# Patient Record
Sex: Male | Born: 1960 | Race: White | Hispanic: No | Smoking: Never smoker
Health system: Southern US, Community
[De-identification: ages and names within clinical notes are randomized; demographics above are authoritative.]

## PROBLEM LIST (undated history)

## (undated) DIAGNOSIS — G8220 Paraplegia, unspecified: Secondary | ICD-10-CM

## (undated) DIAGNOSIS — M7070 Other bursitis of hip, unspecified hip: Secondary | ICD-10-CM

## (undated) DIAGNOSIS — I4510 Unspecified right bundle-branch block: Secondary | ICD-10-CM

## (undated) DIAGNOSIS — F419 Anxiety disorder, unspecified: Secondary | ICD-10-CM

## (undated) DIAGNOSIS — E119 Type 2 diabetes mellitus without complications: Secondary | ICD-10-CM

## (undated) DIAGNOSIS — D649 Anemia, unspecified: Secondary | ICD-10-CM

## (undated) DIAGNOSIS — N3289 Other specified disorders of bladder: Secondary | ICD-10-CM

## (undated) DIAGNOSIS — M62838 Other muscle spasm: Secondary | ICD-10-CM

## (undated) DIAGNOSIS — I890 Lymphedema, not elsewhere classified: Secondary | ICD-10-CM

## (undated) DIAGNOSIS — G4733 Obstructive sleep apnea (adult) (pediatric): Secondary | ICD-10-CM

## (undated) DIAGNOSIS — F329 Major depressive disorder, single episode, unspecified: Secondary | ICD-10-CM

## (undated) DIAGNOSIS — Z789 Other specified health status: Secondary | ICD-10-CM

## (undated) DIAGNOSIS — G4730 Sleep apnea, unspecified: Secondary | ICD-10-CM

## (undated) DIAGNOSIS — K649 Unspecified hemorrhoids: Secondary | ICD-10-CM

## (undated) DIAGNOSIS — I739 Peripheral vascular disease, unspecified: Secondary | ICD-10-CM

## (undated) DIAGNOSIS — Z993 Dependence on wheelchair: Secondary | ICD-10-CM

## (undated) DIAGNOSIS — Z973 Presence of spectacles and contact lenses: Secondary | ICD-10-CM

## (undated) DIAGNOSIS — N319 Neuromuscular dysfunction of bladder, unspecified: Secondary | ICD-10-CM

## (undated) HISTORY — PX: THORACIC FUSION: SHX1062

## (undated) HISTORY — PX: CARPAL TUNNEL RELEASE: SHX101

## (undated) HISTORY — PX: NEUROMA SURGERY: SHX722

## (undated) HISTORY — PX: CYST REMOVAL HAND: SHX6279

---
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
## 1986-06-16 DIAGNOSIS — G8220 Paraplegia, unspecified: Secondary | ICD-10-CM

## 1986-06-16 DIAGNOSIS — N319 Neuromuscular dysfunction of bladder, unspecified: Secondary | ICD-10-CM

## 1986-06-16 HISTORY — DX: Paraplegia, unspecified: G82.20

## 1986-06-16 HISTORY — DX: Neuromuscular dysfunction of bladder, unspecified: N31.9

## 1986-06-16 HISTORY — PX: THORACIC FUSION: SHX1062

## 1987-02-26 DIAGNOSIS — G8220 Paraplegia, unspecified: Secondary | ICD-10-CM | POA: Insufficient documentation

## 2000-05-06 ENCOUNTER — Emergency Department (HOSPITAL_COMMUNITY): Admission: EM | Admit: 2000-05-06 | Discharge: 2000-05-06 | Payer: Self-pay | Admitting: Emergency Medicine

## 2000-05-06 ENCOUNTER — Encounter: Payer: Self-pay | Admitting: Emergency Medicine

## 2002-04-15 ENCOUNTER — Encounter: Payer: Self-pay | Admitting: Internal Medicine

## 2002-04-15 ENCOUNTER — Encounter: Admission: RE | Admit: 2002-04-15 | Discharge: 2002-04-15 | Payer: Self-pay | Admitting: Internal Medicine

## 2002-06-16 HISTORY — PX: CARPAL TUNNEL RELEASE: SHX101

## 2003-08-09 ENCOUNTER — Encounter: Admission: RE | Admit: 2003-08-09 | Discharge: 2003-08-09 | Payer: Self-pay | Admitting: Internal Medicine

## 2005-03-20 ENCOUNTER — Emergency Department (HOSPITAL_COMMUNITY): Admission: EM | Admit: 2005-03-20 | Discharge: 2005-03-21 | Payer: Self-pay | Admitting: Emergency Medicine

## 2006-05-26 ENCOUNTER — Encounter: Admission: RE | Admit: 2006-05-26 | Discharge: 2006-05-26 | Payer: Self-pay | Admitting: Internal Medicine

## 2007-03-20 ENCOUNTER — Emergency Department (HOSPITAL_COMMUNITY): Admission: EM | Admit: 2007-03-20 | Discharge: 2007-03-20 | Payer: Self-pay | Admitting: Emergency Medicine

## 2008-04-13 ENCOUNTER — Inpatient Hospital Stay (HOSPITAL_COMMUNITY): Admission: EM | Admit: 2008-04-13 | Discharge: 2008-04-15 | Payer: Self-pay | Admitting: Emergency Medicine

## 2009-03-26 ENCOUNTER — Encounter (INDEPENDENT_AMBULATORY_CARE_PROVIDER_SITE_OTHER): Payer: Self-pay | Admitting: Orthopedic Surgery

## 2009-03-26 ENCOUNTER — Ambulatory Visit (HOSPITAL_BASED_OUTPATIENT_CLINIC_OR_DEPARTMENT_OTHER): Admission: RE | Admit: 2009-03-26 | Discharge: 2009-03-26 | Payer: Self-pay | Admitting: Orthopedic Surgery

## 2009-03-26 HISTORY — PX: NEUROMA SURGERY: SHX722

## 2010-03-20 ENCOUNTER — Encounter: Admission: RE | Admit: 2010-03-20 | Discharge: 2010-03-20 | Payer: Self-pay | Admitting: Internal Medicine

## 2010-09-19 LAB — POCT HEMOGLOBIN-HEMACUE: Hemoglobin: 17.2 g/dL — ABNORMAL HIGH (ref 13.0–17.0)
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
## 2010-10-29 NOTE — Op Note (Signed)
NAME:  David Odom, David Odom NO.:  [id number]   MEDICAL RECORD NO.:  [id number]          PATIENT TYPE:  INP   LOCATION:  3713                         FACILITY:  [facility]   PHYSICIAN:  Graylin Shiver, M.D.   DATE OF BIRTH:  1960/07/18   DATE OF PROCEDURE:  04/14/2008  DATE OF DISCHARGE:  04/15/2008                               OPERATIVE REPORT   INDICATIONS FOR PROCEDURE:  Rectal bleeding.   Informed consent was obtained after explanation of the risks of  bleeding, infection, and perforation.   PREMEDICATION:  None.   PROCEDURE:  With the patient in the left lateral decubitus position, a  rectal exam was performed.  There were large external hemorrhoids.  There was no blood on the glove finger after rectal examination.  The  Pentax colonoscope was inserted into the rectum and advanced up to 15  cm.  There was brownish stool in the colon.  There was no blood.  There  were no lesions seen in the rectosigmoid area.  There was redness in the  anal canal.  There was no active bleeding.  He tolerated the procedure  well without complications.   IMPRESSION:  Rectal bleeding, most likely of an anorectal/hemorrhoids  source.  No active bleeding at this time.           ______________________________  Graylin Shiver, M.D.     SFG/MEDQ  D:  04/17/2008  T:  04/17/2008  Job:  [id number]   cc:   Theressa Millard, M.D.

## 2011-02-27 ENCOUNTER — Ambulatory Visit (HOSPITAL_COMMUNITY)
Admission: RE | Admit: 2011-02-27 | Discharge: 2011-02-27 | Disposition: A | Discharge status: Home / Self Care | Payer: Medicare Other | Source: Ambulatory Visit | Attending: Gastroenterology | Admitting: Gastroenterology

## 2011-02-27 DIAGNOSIS — Z860100 Personal history of colon polyps, unspecified: Secondary | ICD-10-CM | POA: Insufficient documentation

## 2011-02-27 DIAGNOSIS — K648 Other hemorrhoids: Secondary | ICD-10-CM | POA: Insufficient documentation

## 2011-02-27 DIAGNOSIS — K921 Melena: Secondary | ICD-10-CM | POA: Insufficient documentation

## 2011-02-27 DIAGNOSIS — T43205A Adverse effect of unspecified antidepressants, initial encounter: Secondary | ICD-10-CM | POA: Insufficient documentation

## 2011-02-27 DIAGNOSIS — K5909 Other constipation: Secondary | ICD-10-CM | POA: Insufficient documentation

## 2011-02-27 DIAGNOSIS — T443X5A Adverse effect of other parasympatholytics [anticholinergics and antimuscarinics] and spasmolytics, initial encounter: Secondary | ICD-10-CM | POA: Insufficient documentation

## 2011-02-27 DIAGNOSIS — G8220 Paraplegia, unspecified: Secondary | ICD-10-CM | POA: Insufficient documentation

## 2011-02-27 DIAGNOSIS — R1084 Generalized abdominal pain: Secondary | ICD-10-CM | POA: Insufficient documentation

## 2011-02-27 DIAGNOSIS — T503X5A Adverse effect of electrolytic, caloric and water-balance agents, initial encounter: Secondary | ICD-10-CM | POA: Insufficient documentation

## 2011-02-27 DIAGNOSIS — IMO0002 Reserved for concepts with insufficient information to code with codable children: Secondary | ICD-10-CM | POA: Insufficient documentation
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
## 2011-03-18 LAB — BASIC METABOLIC PANEL
BUN: 11
CO2: 23
Calcium: 8.1 — ABNORMAL LOW
Chloride: 108
Creatinine, Ser: 0.65
GFR calc Af Amer: >60
GFR calc non Af Amer: >60
Glucose, Bld: 99
Potassium: 3.7
Sodium: 137

## 2011-03-18 LAB — DIFFERENTIAL
Basophils Absolute: 0
Basophils Relative: 0
Eosinophils Absolute: 0.2
Eosinophils Relative: 2
Eosinophils Relative: 2
Lymphocytes Relative: 19
Lymphocytes Relative: 22
Lymphs Abs: 1.5
Lymphs Abs: 1.7
Monocytes Absolute: 0.9
Monocytes Relative: 11
Neutro Abs: 5.2
Neutro Abs: 5.3
Neutrophils Relative %: 65
Neutrophils Relative %: 67

## 2011-03-18 LAB — CULTURE, BLOOD (ROUTINE X 2): Culture: NO GROWTH

## 2011-03-18 LAB — CBC
HCT: 42.1
HCT: 43.4
HCT: 47.6
Hemoglobin: 14.5
Hemoglobin: 14.8
MCHC: 34.2
MCHC: 34.5
MCV: 91.9
MCV: 92.2
MCV: 92.2
Platelets: 179
Platelets: 200
Platelets: 221
RBC: 4.56
RBC: 4.72
RDW: 13
RDW: 13.2
RDW: 13.5
WBC: 7.9
WBC: 8.2

## 2011-03-18 LAB — COMPREHENSIVE METABOLIC PANEL
ALT: 72 — ABNORMAL HIGH
AST: 42 — ABNORMAL HIGH
Albumin: 3.9
Alkaline Phosphatase: 75
BUN: 13
Chloride: 107
GFR calc Af Amer: >60
GFR calc non Af Amer: >60
Potassium: 4.4
Total Bilirubin: 0.8
Total Protein: 7

## 2011-03-18 LAB — HEMOGLOBIN AND HEMATOCRIT, BLOOD
HCT: 42.7
HCT: 42.8
HCT: 42.8
HCT: 44.6
HCT: 46.1
Hemoglobin: 14.5
Hemoglobin: 14.6
Hemoglobin: 15.1
Hemoglobin: 15.2
Hemoglobin: 15.7

## 2011-03-18 LAB — POCT CARDIAC MARKERS: CKMB, poc: 4.8

## 2011-03-18 LAB — OCCULT BLOOD X 1 CARD TO LAB, STOOL: Fecal Occult Bld: POSITIVE

## 2011-03-18 LAB — PROTIME-INR: Prothrombin Time: 13.4

## 2012-03-18 DIAGNOSIS — Z1331 Encounter for screening for depression: Secondary | ICD-10-CM | POA: Diagnosis not present

## 2012-03-18 DIAGNOSIS — Z125 Encounter for screening for malignant neoplasm of prostate: Secondary | ICD-10-CM | POA: Diagnosis not present

## 2012-03-18 DIAGNOSIS — R7309 Other abnormal glucose: Secondary | ICD-10-CM | POA: Diagnosis not present

## 2012-03-18 DIAGNOSIS — G4733 Obstructive sleep apnea (adult) (pediatric): Secondary | ICD-10-CM | POA: Diagnosis not present

## 2012-03-18 DIAGNOSIS — Z23 Encounter for immunization: Secondary | ICD-10-CM | POA: Diagnosis not present

## 2012-03-18 DIAGNOSIS — Z0000 Encounter for general adult medical examination without abnormal findings: Secondary | ICD-10-CM | POA: Diagnosis not present

## 2012-03-25 DIAGNOSIS — R7309 Other abnormal glucose: Secondary | ICD-10-CM | POA: Diagnosis not present

## 2012-04-13 DIAGNOSIS — E119 Type 2 diabetes mellitus without complications: Secondary | ICD-10-CM | POA: Diagnosis not present

## 2012-05-19 DIAGNOSIS — E119 Type 2 diabetes mellitus without complications: Secondary | ICD-10-CM | POA: Diagnosis not present

## 2012-06-28 DIAGNOSIS — N390 Urinary tract infection, site not specified: Secondary | ICD-10-CM | POA: Diagnosis not present

## 2012-09-27 DIAGNOSIS — G4733 Obstructive sleep apnea (adult) (pediatric): Secondary | ICD-10-CM | POA: Diagnosis not present

## 2012-09-27 DIAGNOSIS — E119 Type 2 diabetes mellitus without complications: Secondary | ICD-10-CM | POA: Diagnosis not present

## 2012-11-08 DIAGNOSIS — M545 Low back pain: Secondary | ICD-10-CM | POA: Diagnosis not present

## 2012-11-17 DIAGNOSIS — G8220 Paraplegia, unspecified: Secondary | ICD-10-CM | POA: Diagnosis not present

## 2012-11-17 DIAGNOSIS — IMO0002 Reserved for concepts with insufficient information to code with codable children: Secondary | ICD-10-CM | POA: Diagnosis not present

## 2012-12-07 DIAGNOSIS — M546 Pain in thoracic spine: Secondary | ICD-10-CM | POA: Diagnosis not present

## 2012-12-15 ENCOUNTER — Other Ambulatory Visit: Payer: Self-pay | Admitting: Neurological Surgery

## 2012-12-15 DIAGNOSIS — M546 Pain in thoracic spine: Secondary | ICD-10-CM

## 2012-12-21 ENCOUNTER — Ambulatory Visit
Admission: RE | Admit: 2012-12-21 | Discharge: 2012-12-21 | Disposition: A | Discharge status: Home / Self Care | Payer: Medicare Other | Source: Ambulatory Visit | Attending: Neurological Surgery | Admitting: Neurological Surgery

## 2012-12-21 VITALS — BP 64/36 | HR 93

## 2012-12-21 DIAGNOSIS — M546 Pain in thoracic spine: Secondary | ICD-10-CM

## 2012-12-21 MED ORDER — DIAZEPAM 5 MG PO TABS: 10.0000 mg | ORAL_TABLET | Freq: Once | ORAL | Status: DC

## 2012-12-21 MED ORDER — IOHEXOL 300 MG/ML  SOLN
10.0000 mL | Freq: Once | INTRAMUSCULAR | Status: AC | PRN
  Administered 2012-12-21: 10 mL via INTRATHECAL

## 2012-12-21 NOTE — Progress Notes (Signed)
Patient states he has been off Pamelor for the past two days.

## 2012-12-28 DIAGNOSIS — Z6829 Body mass index (BMI) 29.0-29.9, adult: Secondary | ICD-10-CM | POA: Diagnosis not present

## 2012-12-28 DIAGNOSIS — M546 Pain in thoracic spine: Secondary | ICD-10-CM | POA: Diagnosis not present

## 2012-12-29 ENCOUNTER — Encounter (HOSPITAL_COMMUNITY): Payer: Self-pay | Admitting: Pharmacy Technician

## 2012-12-31 ENCOUNTER — Other Ambulatory Visit: Payer: Self-pay | Admitting: Neurological Surgery

## 2013-01-04 ENCOUNTER — Encounter (HOSPITAL_COMMUNITY): Payer: Self-pay

## 2013-01-04 ENCOUNTER — Encounter (HOSPITAL_COMMUNITY)
Admission: RE | Admit: 2013-01-04 | Discharge: 2013-01-04 | Disposition: A | Discharge status: Home / Self Care | Payer: Medicare Other | Source: Ambulatory Visit | Attending: Neurological Surgery | Admitting: Neurological Surgery

## 2013-01-04 ENCOUNTER — Encounter (HOSPITAL_COMMUNITY): Payer: Self-pay | Admitting: Vascular Surgery

## 2013-01-04 ENCOUNTER — Ambulatory Visit (HOSPITAL_COMMUNITY)
Admission: RE | Admit: 2013-01-04 | Discharge: 2013-01-04 | Disposition: A | Discharge status: Home / Self Care | Payer: Medicare Other | Source: Ambulatory Visit | Attending: Neurological Surgery | Admitting: Neurological Surgery

## 2013-01-04 DIAGNOSIS — Z0183 Encounter for blood typing: Secondary | ICD-10-CM | POA: Insufficient documentation

## 2013-01-04 DIAGNOSIS — Z01818 Encounter for other preprocedural examination: Secondary | ICD-10-CM | POA: Diagnosis not present

## 2013-01-04 DIAGNOSIS — Z01812 Encounter for preprocedural laboratory examination: Secondary | ICD-10-CM | POA: Insufficient documentation

## 2013-01-04 HISTORY — DX: Sleep apnea, unspecified: G47.30

## 2013-01-04 HISTORY — DX: Type 2 diabetes mellitus without complications: E11.9

## 2013-01-04 LAB — SURGICAL PCR SCREEN
MRSA, PCR: NEGATIVE
Staphylococcus aureus: NEGATIVE

## 2013-01-04 LAB — BASIC METABOLIC PANEL
BUN: 11 mg/dL (ref 6–23)
Calcium: 8.8 mg/dL (ref 8.4–10.5)
GFR calc Af Amer: >90 mL/min (ref >90)
GFR calc non Af Amer: >90 mL/min (ref >90)
Glucose, Bld: 91 mg/dL (ref 70–99)
Sodium: 138 mEq/L (ref 135–145)

## 2013-01-04 LAB — TYPE AND SCREEN
ABO/RH(D): B POS
Antibody Screen: NEGATIVE

## 2013-01-04 LAB — PROTIME-INR: Prothrombin Time: 14.1 seconds (ref 11.6–15.2)

## 2013-01-04 NOTE — Progress Notes (Signed)
Primary physician - Dr. Particia Lather No cardiologist Sleep study approximately 5 years ago. No prior cardiac testing

## 2013-01-04 NOTE — Pre-Procedure Instructions (Signed)
David Odom  01/04/2013   Your procedure is scheduled on:  Friday, July 25th  Report to [facility] at 1145 AM.  Call this number if you have problems the morning of surgery: [phone]   Remember:   Do not eat food or drink liquids after midnight.   Take these medicines the morning of surgery with A SIP OF WATER: percocet if needed   Do not wear jewelry.  Do not wear lotions, powders, or perfume, deodorant.  Do not shave 48 hours prior to surgery. Men may shave face and neck.  Do not bring valuables to the hospital.  [facility] is not responsible  for any belongings or valuables.  Contacts, dentures or bridgework may not be worn into surgery.  Leave suitcase in the car. After surgery it may be brought to your room.  For patients admitted to the hospital, checkout time is 11:00 AM the day of discharge.   Patients discharged the day of surgery will not be allowed to drive home.    Special Instructions: Shower using CHG 2 nights before surgery and the night before surgery.  If you shower the day of surgery use CHG.  Use special wash - you have one bottle of CHG for all showers.  You should use approximately 1/3 of the bottle for each shower.   Please read over the following fact sheets that you were given: Pain Booklet, Coughing and Deep Breathing, Blood Transfusion Information, MRSA Information and Surgical Site Infection Prevention

## 2013-01-04 NOTE — Progress Notes (Signed)
CRITICAL VALUE ALERT  Critical value received:  Hemoglobin 5.9  Date of notification:  01/04/2013  Time of notification:  1405  Critical value read back:yes  Nurse who received alert:  Luevenia Maxin  MD notified (1st page):  Dr. Yetta Barre  Time of first page:  1430  MD notified (2nd page):  Time of second page:  Responding MD:  Message given to dr. Yetta Barre assistant  Time MD responded:  1430

## 2013-01-05 ENCOUNTER — Other Ambulatory Visit: Payer: Self-pay | Admitting: Neurological Surgery

## 2013-01-05 ENCOUNTER — Encounter (HOSPITAL_COMMUNITY)
Admission: RE | Admit: 2013-01-05 | Discharge: 2013-01-05 | Disposition: A | Discharge status: Home / Self Care | Payer: Medicare Other | Source: Ambulatory Visit | Attending: Neurological Surgery | Admitting: Neurological Surgery

## 2013-01-05 DIAGNOSIS — E119 Type 2 diabetes mellitus without complications: Secondary | ICD-10-CM | POA: Insufficient documentation

## 2013-01-05 DIAGNOSIS — G4733 Obstructive sleep apnea (adult) (pediatric): Secondary | ICD-10-CM | POA: Insufficient documentation

## 2013-01-05 DIAGNOSIS — Z01812 Encounter for preprocedural laboratory examination: Secondary | ICD-10-CM | POA: Insufficient documentation

## 2013-01-05 DIAGNOSIS — D649 Anemia, unspecified: Secondary | ICD-10-CM | POA: Insufficient documentation

## 2013-01-05 LAB — CBC WITH DIFFERENTIAL/PLATELET
Basophils Absolute: 0.1 10*3/uL (ref 0.0–0.1)
Basophils Absolute: 0.1 10*3/uL (ref 0.0–0.1)
Basophils Relative: 1 % (ref 0–1)
Eosinophils Absolute: 0.1 10*3/uL (ref 0.0–0.7)
HCT: 21.4 % — ABNORMAL LOW (ref 39.0–52.0)
Lymphocytes Relative: 23 % (ref 12–46)
MCHC: 27.6 g/dL — ABNORMAL LOW (ref 30.0–36.0)
MCHC: 27.2 g/dL — ABNORMAL LOW (ref 30.0–36.0)
Monocytes Absolute: 0.7 10*3/uL (ref 0.1–1.0)
Monocytes Relative: 13 % — ABNORMAL HIGH (ref 3–12)
Neutro Abs: 3.2 10*3/uL (ref 1.7–7.7)
Neutrophils Relative %: 60 % (ref 43–77)
Platelets: 400 10*3/uL (ref 150–400)
RDW: 18.5 % — ABNORMAL HIGH (ref 11.5–15.5)
RDW: 18.7 % — ABNORMAL HIGH (ref 11.5–15.5)
WBC: 5.9 10*3/uL (ref 4.0–10.5)
WBC: 4.9 10*3/uL (ref 4.0–10.5)

## 2013-01-05 NOTE — Progress Notes (Signed)
Anesthesiology Chart Review:  Patient is a 52 year old male scheduled for T8-9 fusion on 01/07/13 by Dr. Yetta Barre.  History includes non-smoker, OSA, DM2, prior back surgery. PCP is listed as Dr. Theressa Millard.  EKG on 01/04/13 showed NSR, Q wave lead III (consider inferior infarct, age undetermined).  His rate has decreased, but overall I think his EKG is stable when compared to 04/13/08.  CXR on 01/04/13 showed no edema or consolidation.  Dr. Lindalou Hose office was notified of a critical H/H of 5.9/21.4 yesterday.  Dr. Jordan Likes had patient come in again today for repeat labs which showed H/H 5.8/21.3 which is consistent with his previous results.  Target cells were noted on the RBC morphology.  I have called today's results to [facility] at Dr. Yetta Barre' office.  She will review labs with Dr. Yetta Barre for further recommendations.  He will need further evaluation/treatment of his significant anemia prior to surgery.  Velna Ochs [facility]/Anesthesiology Phone [phone] 01/05/2013 2:43 PM

## 2013-01-06 DIAGNOSIS — D649 Anemia, unspecified: Secondary | ICD-10-CM | POA: Diagnosis not present

## 2013-01-07 ENCOUNTER — Encounter (HOSPITAL_COMMUNITY): Admission: RE | Payer: Self-pay | Source: Ambulatory Visit

## 2013-01-07 ENCOUNTER — Inpatient Hospital Stay (HOSPITAL_COMMUNITY): Admission: RE | Admit: 2013-01-07 | Payer: Medicare Other | Source: Ambulatory Visit | Admitting: Neurological Surgery

## 2013-01-07 SURGERY — POSTERIOR LUMBAR FUSION 1 LEVEL
Anesthesia: General | Site: Back

## 2013-01-12 ENCOUNTER — Other Ambulatory Visit: Payer: Self-pay | Admitting: Gastroenterology

## 2013-01-13 DIAGNOSIS — D649 Anemia, unspecified: Secondary | ICD-10-CM | POA: Diagnosis not present

## 2013-01-25 ENCOUNTER — Encounter (HOSPITAL_COMMUNITY): Payer: Self-pay | Admitting: *Deleted

## 2013-01-25 ENCOUNTER — Encounter (HOSPITAL_COMMUNITY)
Admission: RE | Disposition: A | Discharge status: Home / Self Care | Payer: Self-pay | Source: Ambulatory Visit | Attending: Gastroenterology

## 2013-01-25 ENCOUNTER — Ambulatory Visit (HOSPITAL_COMMUNITY)
Admission: RE | Admit: 2013-01-25 | Discharge: 2013-01-25 | Disposition: A | Discharge status: Home / Self Care | Payer: Medicare Other | Source: Ambulatory Visit | Attending: Gastroenterology | Admitting: Gastroenterology

## 2013-01-25 DIAGNOSIS — D649 Anemia, unspecified: Secondary | ICD-10-CM | POA: Diagnosis not present

## 2013-01-25 DIAGNOSIS — D509 Iron deficiency anemia, unspecified: Secondary | ICD-10-CM | POA: Diagnosis not present

## 2013-01-25 DIAGNOSIS — Z860100 Personal history of colon polyps, unspecified: Secondary | ICD-10-CM | POA: Insufficient documentation

## 2013-01-25 DIAGNOSIS — G4733 Obstructive sleep apnea (adult) (pediatric): Secondary | ICD-10-CM | POA: Diagnosis not present

## 2013-01-25 DIAGNOSIS — G8220 Paraplegia, unspecified: Secondary | ICD-10-CM | POA: Diagnosis not present

## 2013-01-25 DIAGNOSIS — E119 Type 2 diabetes mellitus without complications: Secondary | ICD-10-CM | POA: Insufficient documentation

## 2013-01-25 DIAGNOSIS — M810 Age-related osteoporosis without current pathological fracture: Secondary | ICD-10-CM | POA: Insufficient documentation

## 2013-01-25 DIAGNOSIS — E559 Vitamin D deficiency, unspecified: Secondary | ICD-10-CM | POA: Diagnosis not present

## 2013-01-25 HISTORY — PX: ESOPHAGOGASTRODUODENOSCOPY: SHX5428

## 2013-01-25 HISTORY — DX: Anemia, unspecified: D64.9

## 2013-01-25 LAB — GLUCOSE, CAPILLARY: Glucose-Capillary: 85 mg/dL (ref 70–99)

## 2013-01-25 SURGERY — EGD (ESOPHAGOGASTRODUODENOSCOPY)
Anesthesia: Moderate Sedation

## 2013-01-25 MED ORDER — MIDAZOLAM HCL 10 MG/2ML IJ SOLN
INTRAMUSCULAR | Status: AC
  Filled 2013-01-25: qty 4

## 2013-01-25 MED ORDER — SODIUM CHLORIDE 0.9 % IV SOLN
INTRAVENOUS | Status: DC
  Administered 2013-01-25: 500 mL via INTRAVENOUS

## 2013-01-25 MED ORDER — MIDAZOLAM HCL 10 MG/2ML IJ SOLN
INTRAMUSCULAR | Status: DC | PRN
  Administered 2013-01-25 (×2): 2.5 mg via INTRAVENOUS

## 2013-01-25 MED ORDER — DIPHENHYDRAMINE HCL 50 MG/ML IJ SOLN
INTRAMUSCULAR | Status: AC
  Filled 2013-01-25: qty 1

## 2013-01-25 MED ORDER — FENTANYL CITRATE 0.05 MG/ML IJ SOLN
INTRAMUSCULAR | Status: DC | PRN
  Administered 2013-01-25: 50 ug via INTRAVENOUS
  Administered 2013-01-25: 25 ug via INTRAVENOUS

## 2013-01-25 MED ORDER — FENTANYL CITRATE 0.05 MG/ML IJ SOLN
INTRAMUSCULAR | Status: AC
  Filled 2013-01-25: qty 4

## 2013-01-25 MED ORDER — BUTAMBEN-TETRACAINE-BENZOCAINE 2-2-14 % EX AERO
INHALATION_SPRAY | CUTANEOUS | Status: DC | PRN
  Administered 2013-01-25: 2 via TOPICAL

## 2013-01-25 NOTE — OR Nursing (Signed)
Post-op follow up call.  Spoke with patient who reports no problems or concerns.

## 2013-01-25 NOTE — Op Note (Signed)
Problem: Microcytic anemia. Normal surveillance colonoscopy on 02/27/2011. Colon preparation fair.  Procedure: Diagnostic esophagogastroduodenoscopy with small bowel biopsies to rule out celiac disease.  Endoscopist: Danise Edge  Premedication: Fentanyl 75 mcg intravenously. Versed 5 mg intravenously.   Procedure: The patient was placed in the supine position. The Pentax gastroscope was passed through the posterior hypopharynx into the proximal esophagus without difficulty. The hypopharynx, larynx, and vocal cords appeared normal  Esophagoscopy: The proximal, mid, and lower segments of the esophageal mucosa appear normal. The squamocolumnar junction is noted at 40 cm from the incisor teeth.  Gastroscopy: Retroflex view of the gastric cardia and fundus was normal. The gastric body, antrum, and pylorus appeared normal.  Duodenoscopy: The duodenal bulb and descending duodenum appeared.  Biopsies: 4 biopsies were taken from the descending duodenum and a biopsy was taken from the distal duodenal bulb to rule out villous atrophy associated with celiac disease.  Assessment: Normal esophagogastroduodenoscopy. Small bowel biopsies to rule out celiac disease pending.  Recommendations: If small bowel biopsies returned normal pathologically, the patient should undergo a repeat colonoscopy. If repeat colonoscopy is normal, he should undergo a CT enterography or capsule enteroscopy.

## 2013-01-25 NOTE — H&P (Signed)
  Problem: Microcytic anemia  History: The patient is a 52 year old male born [birth year]. The patient has unexplained microcytic anemia with a hemoglobin 6.9 g. He denies gastrointestinal bleeding. On 06/29/2006, the patient underwent a diagnostic colonoscopy to evaluate hematochezia. A 3 mm polyp was removed from the  Splenic flexure. On 02/27/2011, underwent a normal surveillance proctocolonoscopy to the cecum.  The patient is scheduled to undergo a diagnostic esophagogastroduodenoscopy to evaluate microcytic anemia.  Past medical history: Severe obstructive sleep apnea. Vitamin D deficiency. L1 paraplegia. Type 2 diabetes mellitus . Colonic adenoma removed colonoscopically in 2008. Osteoporosis.  Allergies: Bee sting allergy  Exam: The patient is alert and lying comfortably on the endoscopy stretcher. Abdomen is soft and nontender to palpation. Cardiac exam reveals a regular rhythm. Lungs are clear to auscultation.  Plan: Proceed with diagnostic colonoscopy to evaluate microcytic anemia.

## 2013-01-26 ENCOUNTER — Encounter (HOSPITAL_COMMUNITY): Payer: Self-pay | Admitting: Gastroenterology

## 2013-02-03 DIAGNOSIS — D649 Anemia, unspecified: Secondary | ICD-10-CM | POA: Diagnosis not present

## 2013-02-09 ENCOUNTER — Other Ambulatory Visit: Payer: Self-pay | Admitting: Gastroenterology

## 2013-02-09 DIAGNOSIS — D509 Iron deficiency anemia, unspecified: Secondary | ICD-10-CM | POA: Diagnosis not present

## 2013-03-08 ENCOUNTER — Encounter (HOSPITAL_COMMUNITY)
Admission: RE | Disposition: A | Discharge status: Home / Self Care | Payer: Self-pay | Source: Ambulatory Visit | Attending: Gastroenterology

## 2013-03-08 ENCOUNTER — Encounter (HOSPITAL_COMMUNITY): Payer: Self-pay | Admitting: *Deleted

## 2013-03-08 ENCOUNTER — Ambulatory Visit (HOSPITAL_COMMUNITY)
Admission: RE | Admit: 2013-03-08 | Discharge: 2013-03-08 | Disposition: A | Discharge status: Home / Self Care | Payer: Medicare Other | Source: Ambulatory Visit | Attending: Gastroenterology | Admitting: Gastroenterology

## 2013-03-08 DIAGNOSIS — D509 Iron deficiency anemia, unspecified: Secondary | ICD-10-CM | POA: Diagnosis not present

## 2013-03-08 DIAGNOSIS — K648 Other hemorrhoids: Secondary | ICD-10-CM | POA: Insufficient documentation

## 2013-03-08 DIAGNOSIS — Z860100 Personal history of colon polyps, unspecified: Secondary | ICD-10-CM | POA: Insufficient documentation

## 2013-03-08 DIAGNOSIS — M810 Age-related osteoporosis without current pathological fracture: Secondary | ICD-10-CM | POA: Insufficient documentation

## 2013-03-08 DIAGNOSIS — E119 Type 2 diabetes mellitus without complications: Secondary | ICD-10-CM | POA: Diagnosis not present

## 2013-03-08 DIAGNOSIS — G4733 Obstructive sleep apnea (adult) (pediatric): Secondary | ICD-10-CM | POA: Insufficient documentation

## 2013-03-08 DIAGNOSIS — G8220 Paraplegia, unspecified: Secondary | ICD-10-CM | POA: Insufficient documentation

## 2013-03-08 HISTORY — PX: COLONOSCOPY: SHX5424

## 2013-03-08 SURGERY — COLONOSCOPY
Anesthesia: Moderate Sedation

## 2013-03-08 MED ORDER — SODIUM CHLORIDE 0.9 % IV SOLN: INTRAVENOUS | Status: DC

## 2013-03-08 MED ORDER — FENTANYL CITRATE 0.05 MG/ML IJ SOLN
INTRAMUSCULAR | Status: AC
  Filled 2013-03-08: qty 2

## 2013-03-08 MED ORDER — MIDAZOLAM HCL 10 MG/2ML IJ SOLN
INTRAMUSCULAR | Status: AC
  Filled 2013-03-08: qty 2

## 2013-03-08 NOTE — H&P (Signed)
  Problem: Unexplained iron deficiency anemia  History: The patient is a 52 year old male born 03/17/1961. The patient has unexplained iron deficiency anemia. Diagnostic esophagogastroduodenoscopy with small bowel biopsies was normal. He underwent a normal surveillance colonoscopy in September 2012; the colon prep was fair.  The patient is scheduled to undergo a diagnostic colonoscopy to evaluate iron deficiency anemia today.  Past medical history: Obstructive sleep apnea. Vitamin D Deficiency. L- 1 paraplegia. Lower gastrointestinal bleeding in 2009. Osteoporosis. Adenomatous colon polyp removed in 2008. Type 2 diabetes mellitus.  Allergies: Bee sting  Exam: the patient is alert and lying comfortably on the endoscopy stretcher. Abdomen is soft and nontender to palpation. Lungs are clear to auscultation. Cardiac exam reveals a regular rhythm.  Plan: Proceed with diagnostic colonoscopy to evaluate iron deficiency anemia.

## 2013-03-08 NOTE — Op Note (Signed)
Problem: Unexplained iron deficiency anemia associated with a normal esophagogastroduodenoscopy with small bowel biopsies. Adenomatous colon polyps removed colonoscopically in the past.  Endoscopist: Danise Edge  Medication: None  Procedure: Diagnostic colonoscopy The patient was placed in the left lateral decubitus position. Anal inspection and digital rectal exam were normal. The Pentax pediatric colonoscope was introduced into the rectum and easily advanced to the cecum. A normal-appearing appendiceal orifice and ileocecal valve were identified. Colonic preparation for the exam today was good.  Rectum. Normal. Retroflex view of the distal rectum reveals large internal hemorrhoids.  Sigmoid colon and descending colon. Normal.  Splenic flexure. Normal.  Transverse colon. Normal.  Hepatic flexure. Normal.  Ascending colon. Normal.  Cecum and ileocecal valve. Normal.  Assessment: Normal proctocolonoscopy to the cecum  Recommendations: Schedule surveillance colonoscopy in 5 years.

## 2013-03-09 ENCOUNTER — Encounter (HOSPITAL_COMMUNITY): Payer: Self-pay | Admitting: Gastroenterology

## 2013-03-17 DIAGNOSIS — L89109 Pressure ulcer of unspecified part of back, unspecified stage: Secondary | ICD-10-CM | POA: Diagnosis not present

## 2013-03-23 ENCOUNTER — Encounter (HOSPITAL_BASED_OUTPATIENT_CLINIC_OR_DEPARTMENT_OTHER): Payer: Medicare Other | Attending: General Surgery

## 2013-03-23 DIAGNOSIS — L8993 Pressure ulcer of unspecified site, stage 3: Secondary | ICD-10-CM | POA: Insufficient documentation

## 2013-03-23 DIAGNOSIS — Z79899 Other long term (current) drug therapy: Secondary | ICD-10-CM | POA: Diagnosis not present

## 2013-03-23 DIAGNOSIS — G8220 Paraplegia, unspecified: Secondary | ICD-10-CM | POA: Diagnosis not present

## 2013-03-23 DIAGNOSIS — L89309 Pressure ulcer of unspecified buttock, unspecified stage: Secondary | ICD-10-CM | POA: Insufficient documentation
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
## 2013-03-25 DIAGNOSIS — Z86010 Personal history of colonic polyps: Secondary | ICD-10-CM | POA: Diagnosis not present

## 2013-03-25 DIAGNOSIS — E559 Vitamin D deficiency, unspecified: Secondary | ICD-10-CM | POA: Diagnosis not present

## 2013-03-25 DIAGNOSIS — E119 Type 2 diabetes mellitus without complications: Secondary | ICD-10-CM | POA: Diagnosis not present

## 2013-03-25 DIAGNOSIS — Z1331 Encounter for screening for depression: Secondary | ICD-10-CM | POA: Diagnosis not present

## 2013-03-25 DIAGNOSIS — Z0000 Encounter for general adult medical examination without abnormal findings: Secondary | ICD-10-CM | POA: Diagnosis not present

## 2013-03-25 DIAGNOSIS — G8220 Paraplegia, unspecified: Secondary | ICD-10-CM | POA: Diagnosis not present

## 2013-03-25 DIAGNOSIS — Z125 Encounter for screening for malignant neoplasm of prostate: Secondary | ICD-10-CM | POA: Diagnosis not present

## 2013-03-25 DIAGNOSIS — G4733 Obstructive sleep apnea (adult) (pediatric): Secondary | ICD-10-CM | POA: Diagnosis not present

## 2013-03-25 DIAGNOSIS — D509 Iron deficiency anemia, unspecified: Secondary | ICD-10-CM | POA: Diagnosis not present

## 2013-03-30 DIAGNOSIS — L89309 Pressure ulcer of unspecified buttock, unspecified stage: Secondary | ICD-10-CM | POA: Diagnosis not present

## 2013-03-30 DIAGNOSIS — L8993 Pressure ulcer of unspecified site, stage 3: Secondary | ICD-10-CM | POA: Diagnosis not present

## 2013-03-30 DIAGNOSIS — Z79899 Other long term (current) drug therapy: Secondary | ICD-10-CM | POA: Diagnosis not present

## 2013-03-30 DIAGNOSIS — G8220 Paraplegia, unspecified: Secondary | ICD-10-CM | POA: Diagnosis not present

## 2013-04-05 ENCOUNTER — Other Ambulatory Visit: Payer: Self-pay | Admitting: Neurological Surgery

## 2013-04-06 DIAGNOSIS — L8993 Pressure ulcer of unspecified site, stage 3: Secondary | ICD-10-CM | POA: Diagnosis not present

## 2013-04-06 DIAGNOSIS — Z79899 Other long term (current) drug therapy: Secondary | ICD-10-CM | POA: Diagnosis not present

## 2013-04-06 DIAGNOSIS — L89309 Pressure ulcer of unspecified buttock, unspecified stage: Secondary | ICD-10-CM | POA: Diagnosis not present

## 2013-04-06 DIAGNOSIS — G8220 Paraplegia, unspecified: Secondary | ICD-10-CM | POA: Diagnosis not present

## 2013-04-13 ENCOUNTER — Encounter (HOSPITAL_COMMUNITY): Payer: Self-pay | Admitting: Pharmacy Technician

## 2013-04-13 DIAGNOSIS — L8993 Pressure ulcer of unspecified site, stage 3: Secondary | ICD-10-CM | POA: Diagnosis not present

## 2013-04-13 DIAGNOSIS — L89309 Pressure ulcer of unspecified buttock, unspecified stage: Secondary | ICD-10-CM | POA: Diagnosis not present

## 2013-04-13 DIAGNOSIS — Z79899 Other long term (current) drug therapy: Secondary | ICD-10-CM | POA: Diagnosis not present

## 2013-04-13 DIAGNOSIS — G8220 Paraplegia, unspecified: Secondary | ICD-10-CM | POA: Diagnosis not present

## 2013-04-14 NOTE — Pre-Procedure Instructions (Signed)
David Odom  04/14/2013   Your procedure is scheduled on:  Nov. 7  @1119  (Fri)  Report to [facility] at 0915 AM.  Call this number if you have problems the morning of surgery: [phone]   Remember:   Do not eat food or drink liquids after midnight.   Take these medicines the morning of surgery with A SIP OF WATER: Nortriptyline (pamelor), and Oxybutyin (Ditropan Xl)   Do not wear jewelry, make-up or nail polish.  Do not wear lotions, powders, or perfumes. You may wear deodorant.  Do not shave 48 hours prior to surgery. Men may shave face and neck.  Do not bring valuables to the hospital.  [facility] is not responsible                  for any belongings or valuables.               Contacts, dentures or bridgework may not be worn into surgery.  Leave suitcase in the car. After surgery it may be brought to your room.  For patients admitted to the hospital, discharge time is determined by your                treatment team.               Patients discharged the day of surgery will not be allowed to drive  home.    Special Instructions: Shower using CHG 2 nights before surgery and the night before surgery.  If you shower the day of surgery use CHG.  Use special wash - you have one bottle of CHG for all showers.  You should use approximately 1/3 of the bottle for each shower.   Please read over the following fact sheets that you were given: Pain Booklet, Coughing and Deep Breathing, Blood Transfusion Information, MRSA Information and Surgical Site Infection Prevention

## 2013-04-15 ENCOUNTER — Encounter (HOSPITAL_COMMUNITY)
Admission: RE | Admit: 2013-04-15 | Discharge: 2013-04-15 | Disposition: A | Discharge status: Home / Self Care | Payer: Medicare Other | Source: Ambulatory Visit | Attending: Neurological Surgery | Admitting: Neurological Surgery

## 2013-04-15 ENCOUNTER — Encounter (HOSPITAL_COMMUNITY): Payer: Self-pay

## 2013-04-15 DIAGNOSIS — Z01818 Encounter for other preprocedural examination: Secondary | ICD-10-CM | POA: Diagnosis not present

## 2013-04-15 DIAGNOSIS — Z01812 Encounter for preprocedural laboratory examination: Secondary | ICD-10-CM | POA: Diagnosis not present

## 2013-04-15 LAB — CBC WITH DIFFERENTIAL/PLATELET
Basophils Absolute: 0 10*3/uL (ref 0.0–0.1)
Eosinophils Relative: 3 % (ref 0–5)
HCT: 37.5 % — ABNORMAL LOW (ref 39.0–52.0)
Hemoglobin: 12.5 g/dL — ABNORMAL LOW (ref 13.0–17.0)
Lymphocytes Relative: 30 % (ref 12–46)
Lymphs Abs: 1.6 10*3/uL (ref 0.7–4.0)
MCH: 28.9 pg (ref 26.0–34.0)
MCV: 86.8 fL (ref 78.0–100.0)
Platelets: 211 10*3/uL (ref 150–400)
RBC: 4.32 MIL/uL (ref 4.22–5.81)
WBC: 5.3 10*3/uL (ref 4.0–10.5)

## 2013-04-15 LAB — BASIC METABOLIC PANEL
CO2: 24 mEq/L (ref 19–32)
Chloride: 105 mEq/L (ref 96–112)
Glucose, Bld: 108 mg/dL — ABNORMAL HIGH (ref 70–99)
Potassium: 4 mEq/L (ref 3.5–5.1)
Sodium: 139 mEq/L (ref 135–145)

## 2013-04-15 LAB — TYPE AND SCREEN

## 2013-04-15 LAB — PROTIME-INR: Prothrombin Time: 13.2 seconds (ref 11.6–15.2)

## 2013-04-15 NOTE — Progress Notes (Signed)
PCP is Dr Theressa Millard Denies seeing a cardiologist EKG and CXR noted from January 04, 2013 in epic. Denies having an echo, stress test, or card cath.

## 2013-04-20 ENCOUNTER — Encounter (HOSPITAL_BASED_OUTPATIENT_CLINIC_OR_DEPARTMENT_OTHER): Payer: Medicare Other | Attending: General Surgery

## 2013-04-20 DIAGNOSIS — L89309 Pressure ulcer of unspecified buttock, unspecified stage: Secondary | ICD-10-CM | POA: Insufficient documentation

## 2013-04-20 DIAGNOSIS — L8993 Pressure ulcer of unspecified site, stage 3: Secondary | ICD-10-CM | POA: Insufficient documentation

## 2013-04-21 ENCOUNTER — Other Ambulatory Visit: Payer: Self-pay

## 2013-04-21 MED ORDER — CEFAZOLIN SODIUM-DEXTROSE 2-3 GM-% IV SOLR
2.0000 g | INTRAVENOUS | Status: AC
  Administered 2013-04-22: 2 g via INTRAVENOUS
  Filled 2013-04-21: qty 50

## 2013-04-22 ENCOUNTER — Inpatient Hospital Stay (HOSPITAL_COMMUNITY)
Admission: RE | Admit: 2013-04-22 | Discharge: 2013-04-23 | DRG: 460 | Disposition: A | Discharge status: Home / Self Care | Payer: Medicare Other | Source: Ambulatory Visit | Attending: Neurological Surgery | Admitting: Neurological Surgery

## 2013-04-22 ENCOUNTER — Encounter (HOSPITAL_COMMUNITY): Payer: Medicare Other | Admitting: Certified Registered"

## 2013-04-22 ENCOUNTER — Encounter (HOSPITAL_COMMUNITY)
Admission: RE | Disposition: A | Discharge status: Home / Self Care | Payer: Self-pay | Source: Ambulatory Visit | Attending: Neurological Surgery

## 2013-04-22 ENCOUNTER — Inpatient Hospital Stay (HOSPITAL_COMMUNITY): Payer: Medicare Other

## 2013-04-22 ENCOUNTER — Encounter (HOSPITAL_COMMUNITY): Payer: Self-pay | Admitting: *Deleted

## 2013-04-22 ENCOUNTER — Inpatient Hospital Stay (HOSPITAL_COMMUNITY): Payer: Medicare Other | Admitting: Certified Registered"

## 2013-04-22 DIAGNOSIS — M479 Spondylosis, unspecified: Secondary | ICD-10-CM | POA: Diagnosis not present

## 2013-04-22 DIAGNOSIS — E119 Type 2 diabetes mellitus without complications: Secondary | ICD-10-CM | POA: Diagnosis present

## 2013-04-22 DIAGNOSIS — M47814 Spondylosis without myelopathy or radiculopathy, thoracic region: Principal | ICD-10-CM | POA: Diagnosis present

## 2013-04-22 DIAGNOSIS — G8220 Paraplegia, unspecified: Secondary | ICD-10-CM | POA: Diagnosis present

## 2013-04-22 DIAGNOSIS — M539 Dorsopathy, unspecified: Secondary | ICD-10-CM | POA: Diagnosis not present

## 2013-04-22 DIAGNOSIS — G4730 Sleep apnea, unspecified: Secondary | ICD-10-CM | POA: Diagnosis present

## 2013-04-22 DIAGNOSIS — M4804 Spinal stenosis, thoracic region: Secondary | ICD-10-CM | POA: Diagnosis not present

## 2013-04-22 HISTORY — PX: THORACIC FUSION: SHX1062

## 2013-04-22 HISTORY — DX: Paraplegia, unspecified: G82.20

## 2013-04-22 LAB — GLUCOSE, CAPILLARY
Glucose-Capillary: 55 mg/dL — ABNORMAL LOW (ref 70–99)
Glucose-Capillary: 218 mg/dL — ABNORMAL HIGH (ref 70–99)
Glucose-Capillary: 126 mg/dL — ABNORMAL HIGH (ref 70–99)

## 2013-04-22 SURGERY — POSTERIOR LUMBAR FUSION 1 LEVEL
Anesthesia: General | Site: Back | Wound class: Clean

## 2013-04-22 MED ORDER — THROMBIN 5000 UNITS EX SOLR
OROMUCOSAL | Status: DC | PRN
  Administered 2013-04-22: 12:00:00 via TOPICAL

## 2013-04-22 MED ORDER — DEXTROSE-NACL 5-0.9 % IV SOLN
INTRAVENOUS | Status: DC
  Administered 2013-04-22: 11:00:00 via INTRAVENOUS

## 2013-04-22 MED ORDER — METOCLOPRAMIDE HCL 5 MG/ML IJ SOLN: 10.0000 mg | Freq: Once | INTRAMUSCULAR | Status: DC | PRN

## 2013-04-22 MED ORDER — LACTATED RINGERS IV SOLN: Freq: Once | INTRAVENOUS | Status: DC

## 2013-04-22 MED ORDER — OXYCODONE HCL 5 MG PO TABS
5.0000 mg | ORAL_TABLET | Freq: Once | ORAL | Status: AC | PRN
  Administered 2013-04-22: 5 mg via ORAL

## 2013-04-22 MED ORDER — NEOSTIGMINE METHYLSULFATE 1 MG/ML IJ SOLN
INTRAMUSCULAR | Status: DC | PRN
  Administered 2013-04-22: 4 mg via INTRAVENOUS

## 2013-04-22 MED ORDER — LIDOCAINE HCL (CARDIAC) 20 MG/ML IV SOLN
INTRAVENOUS | Status: DC | PRN
  Administered 2013-04-22: 100 mg via INTRAVENOUS

## 2013-04-22 MED ORDER — MORPHINE SULFATE 2 MG/ML IJ SOLN: 1.0000 mg | INTRAMUSCULAR | Status: DC | PRN

## 2013-04-22 MED ORDER — MIDAZOLAM HCL 5 MG/5ML IJ SOLN
INTRAMUSCULAR | Status: DC | PRN
  Administered 2013-04-22: 2 mg via INTRAVENOUS

## 2013-04-22 MED ORDER — PHENOL 1.4 % MT LIQD: 1.0000 | OROMUCOSAL | Status: DC | PRN

## 2013-04-22 MED ORDER — POTASSIUM CHLORIDE IN NACL 20-0.9 MEQ/L-% IV SOLN
INTRAVENOUS | Status: DC
  Administered 2013-04-22: 16:00:00 via INTRAVENOUS
  Filled 2013-04-22 (×4): qty 1000

## 2013-04-22 MED ORDER — MENTHOL 3 MG MT LOZG: 1.0000 | LOZENGE | OROMUCOSAL | Status: DC | PRN

## 2013-04-22 MED ORDER — ACETAMINOPHEN 325 MG PO TABS: 650.0000 mg | ORAL_TABLET | ORAL | Status: DC | PRN

## 2013-04-22 MED ORDER — ONDANSETRON HCL 4 MG/2ML IJ SOLN: 4.0000 mg | INTRAMUSCULAR | Status: DC | PRN

## 2013-04-22 MED ORDER — SODIUM CHLORIDE 0.9 % IR SOLN
Status: DC | PRN
  Administered 2013-04-22: 12:00:00

## 2013-04-22 MED ORDER — CYCLOBENZAPRINE HCL 10 MG PO TABS
10.0000 mg | ORAL_TABLET | Freq: Three times a day (TID) | ORAL | Status: DC | PRN
  Administered 2013-04-22: 10 mg via ORAL
  Filled 2013-04-22: qty 1

## 2013-04-22 MED ORDER — ACETAMINOPHEN 650 MG RE SUPP: 650.0000 mg | RECTAL | Status: DC | PRN

## 2013-04-22 MED ORDER — THROMBIN 20000 UNITS EX SOLR
CUTANEOUS | Status: DC | PRN
  Administered 2013-04-22: 12:00:00 via TOPICAL

## 2013-04-22 MED ORDER — DEXAMETHASONE SODIUM PHOSPHATE 10 MG/ML IJ SOLN
INTRAMUSCULAR | Status: AC
  Administered 2013-04-22: 10 mg via INTRAVENOUS
  Filled 2013-04-22: qty 1

## 2013-04-22 MED ORDER — SULFAMETHOXAZOLE-TMP DS 800-160 MG PO TABS
1.0000 | ORAL_TABLET | Freq: Once | ORAL | Status: AC
  Administered 2013-04-22: 1 via ORAL
  Filled 2013-04-22 (×2): qty 1

## 2013-04-22 MED ORDER — SODIUM CHLORIDE 0.9 % IJ SOLN: 3.0000 mL | INTRAMUSCULAR | Status: DC | PRN

## 2013-04-22 MED ORDER — OXYCODONE HCL 5 MG/5ML PO SOLN: 5.0000 mg | Freq: Once | ORAL | Status: AC | PRN

## 2013-04-22 MED ORDER — LACTATED RINGERS IV SOLN
INTRAVENOUS | Status: DC | PRN
  Administered 2013-04-22: 12:00:00 via INTRAVENOUS

## 2013-04-22 MED ORDER — OXYBUTYNIN CHLORIDE ER 15 MG PO TB24
15.0000 mg | ORAL_TABLET | Freq: Every day | ORAL | Status: DC
  Administered 2013-04-22 – 2013-04-23 (×2): 15 mg via ORAL
  Filled 2013-04-22 (×2): qty 1

## 2013-04-22 MED ORDER — ONDANSETRON HCL 4 MG/2ML IJ SOLN
INTRAMUSCULAR | Status: DC | PRN
  Administered 2013-04-22: 4 mg via INTRAVENOUS

## 2013-04-22 MED ORDER — FENTANYL CITRATE 0.05 MG/ML IJ SOLN
INTRAMUSCULAR | Status: DC | PRN
  Administered 2013-04-22: 50 ug via INTRAVENOUS
  Administered 2013-04-22: 100 ug via INTRAVENOUS

## 2013-04-22 MED ORDER — HYDROMORPHONE HCL PF 1 MG/ML IJ SOLN
0.2500 mg | INTRAMUSCULAR | Status: DC | PRN
  Administered 2013-04-22: 0.5 mg via INTRAVENOUS

## 2013-04-22 MED ORDER — CYCLOBENZAPRINE HCL 10 MG PO TABS
ORAL_TABLET | ORAL | Status: AC
  Filled 2013-04-22: qty 1

## 2013-04-22 MED ORDER — HYDROMORPHONE HCL PF 1 MG/ML IJ SOLN
INTRAMUSCULAR | Status: AC
  Filled 2013-04-22: qty 1

## 2013-04-22 MED ORDER — SODIUM CHLORIDE 0.9 % IV SOLN: 250.0000 mL | INTRAVENOUS | Status: DC

## 2013-04-22 MED ORDER — BUPIVACAINE HCL (PF) 0.25 % IJ SOLN
INTRAMUSCULAR | Status: DC | PRN
  Administered 2013-04-22: 4 mL

## 2013-04-22 MED ORDER — NORTRIPTYLINE HCL 25 MG PO CAPS
75.0000 mg | ORAL_CAPSULE | Freq: Every day | ORAL | Status: DC
  Administered 2013-04-22 – 2013-04-23 (×2): 75 mg via ORAL
  Filled 2013-04-22 (×2): qty 3

## 2013-04-22 MED ORDER — GLYCOPYRROLATE 0.2 MG/ML IJ SOLN
INTRAMUSCULAR | Status: DC | PRN
  Administered 2013-04-22: 0.6 mg via INTRAVENOUS

## 2013-04-22 MED ORDER — HYDROCODONE-ACETAMINOPHEN 5-325 MG PO TABS
1.0000 | ORAL_TABLET | ORAL | Status: DC | PRN
  Administered 2013-04-22: 2 via ORAL
  Administered 2013-04-23: 1 via ORAL
  Filled 2013-04-22: qty 2
  Filled 2013-04-22: qty 1

## 2013-04-22 MED ORDER — SODIUM CHLORIDE 0.9 % IJ SOLN: 3.0000 mL | Freq: Two times a day (BID) | INTRAMUSCULAR | Status: DC

## 2013-04-22 MED ORDER — CEFAZOLIN SODIUM 1-5 GM-% IV SOLN
1.0000 g | Freq: Three times a day (TID) | INTRAVENOUS | Status: AC
  Administered 2013-04-22 – 2013-04-23 (×2): 1 g via INTRAVENOUS
  Filled 2013-04-22 (×2): qty 50

## 2013-04-22 MED ORDER — OXYCODONE HCL 5 MG PO TABS
ORAL_TABLET | ORAL | Status: AC
  Filled 2013-04-22: qty 1

## 2013-04-22 MED ORDER — PROPOFOL 10 MG/ML IV BOLUS
INTRAVENOUS | Status: DC | PRN
  Administered 2013-04-22: 200 mg via INTRAVENOUS

## 2013-04-22 MED ORDER — ARTIFICIAL TEARS OP OINT
TOPICAL_OINTMENT | OPHTHALMIC | Status: DC | PRN
  Administered 2013-04-22: 1 via OPHTHALMIC

## 2013-04-22 MED ORDER — ROCURONIUM BROMIDE 100 MG/10ML IV SOLN
INTRAVENOUS | Status: DC | PRN
  Administered 2013-04-22: 50 mg via INTRAVENOUS

## 2013-04-22 SURGICAL SUPPLY — 59 items
APL SKNCLS STERI-STRIP NONHPOA (GAUZE/BANDAGES/DRESSINGS) ×1
BAG DECANTER FOR FLEXI CONT (MISCELLANEOUS) ×2 IMPLANT
BENZOIN TINCTURE PRP APPL 2/3 (GAUZE/BANDAGES/DRESSINGS) ×2 IMPLANT
BLADE SURG ROTATE 9660 (MISCELLANEOUS) IMPLANT
BONE MATRIX OSTEOCEL PRO MED (Bone Implant) ×1 IMPLANT
BUR MATCHSTICK NEURO 3.0 LAGG (BURR) ×2 IMPLANT
CANISTER SUCT 3000ML (MISCELLANEOUS) ×2 IMPLANT
CONT SPEC 4OZ CLIKSEAL STRL BL (MISCELLANEOUS) ×4 IMPLANT
COVER BACK TABLE 24X17X13 BIG (DRAPES) IMPLANT
COVER TABLE BACK 60X90 (DRAPES) ×2 IMPLANT
DRAIN CHANNEL 7F 3/4 FLAT (WOUND CARE) ×1 IMPLANT
DRAPE C-ARM 42X72 X-RAY (DRAPES) ×4 IMPLANT
DRAPE C-ARMOR (DRAPES) ×1 IMPLANT
DRAPE LAPAROTOMY 100X72X124 (DRAPES) ×2 IMPLANT
DRAPE POUCH INSTRU U-SHP 10X18 (DRAPES) ×2 IMPLANT
DRAPE SURG 17X23 STRL (DRAPES) ×2 IMPLANT
DRESSING TELFA 8X3 (GAUZE/BANDAGES/DRESSINGS) ×2 IMPLANT
DRSG OPSITE 4X5.5 SM (GAUZE/BANDAGES/DRESSINGS) ×4 IMPLANT
DRSG OPSITE POSTOP 4X6 (GAUZE/BANDAGES/DRESSINGS) ×1 IMPLANT
DURAPREP 26ML APPLICATOR (WOUND CARE) ×2 IMPLANT
ELECT REM PT RETURN 9FT ADLT (ELECTROSURGICAL) ×2
ELECTRODE REM PT RTRN 9FT ADLT (ELECTROSURGICAL) ×1 IMPLANT
EVACUATOR 1/8 PVC DRAIN (DRAIN) ×2 IMPLANT
EVACUATOR SILICONE 100CC (DRAIN) ×1 IMPLANT
GAUZE SPONGE 4X4 16PLY XRAY LF (GAUZE/BANDAGES/DRESSINGS) IMPLANT
GLOVE BIO SURGEON STRL SZ8 (GLOVE) ×5 IMPLANT
GLOVE INDICATOR 8.5 STRL (GLOVE) ×1 IMPLANT
GLOVE SS BIOGEL STRL SZ 7 (GLOVE) IMPLANT
GLOVE SUPERSENSE BIOGEL SZ 7 (GLOVE) ×3
GOWN BRE IMP SLV AUR LG STRL (GOWN DISPOSABLE) ×1 IMPLANT
GOWN BRE IMP SLV AUR XL STRL (GOWN DISPOSABLE) ×4 IMPLANT
GOWN STRL REIN 2XL LVL4 (GOWN DISPOSABLE) ×2 IMPLANT
HEMOSTAT POWDER KIT SURGIFOAM (HEMOSTASIS) IMPLANT
KIT BASIN OR (CUSTOM PROCEDURE TRAY) ×2 IMPLANT
KIT ROOM TURNOVER OR (KITS) ×2 IMPLANT
MILL MEDIUM DISP (BLADE) IMPLANT
NDL HYPO 25X1 1.5 SAFETY (NEEDLE) ×1 IMPLANT
NEEDLE HYPO 25X1 1.5 SAFETY (NEEDLE) ×2 IMPLANT
NS IRRIG 1000ML POUR BTL (IV SOLUTION) ×2 IMPLANT
PACK LAMINECTOMY NEURO (CUSTOM PROCEDURE TRAY) ×2 IMPLANT
PAD ARMBOARD 7.5X6 YLW CONV (MISCELLANEOUS) ×6 IMPLANT
ROD ARM15T 50MM (Rod) ×1 IMPLANT
SCREW LOCK (Screw) ×4 IMPLANT
SCREW LOCK 100X5.5X OPN (Screw) IMPLANT
SCREW POLY 5.5X45MM (Screw) ×2 IMPLANT
SPONGE LAP 4X18 X RAY DECT (DISPOSABLE) IMPLANT
SPONGE SURGIFOAM ABS GEL 100 (HEMOSTASIS) ×2 IMPLANT
STRIP CLOSURE SKIN 1/2X4 (GAUZE/BANDAGES/DRESSINGS) ×4 IMPLANT
SUT VIC AB 0 CT1 18XCR BRD8 (SUTURE) ×1 IMPLANT
SUT VIC AB 0 CT1 8-18 (SUTURE) ×2
SUT VIC AB 2-0 CP2 18 (SUTURE) ×2 IMPLANT
SUT VIC AB 3-0 SH 8-18 (SUTURE) ×4 IMPLANT
SYR 20ML ECCENTRIC (SYRINGE) ×2 IMPLANT
TAPE STRIPS DRAPE STRL (GAUZE/BANDAGES/DRESSINGS) ×1 IMPLANT
TOWEL OR 17X24 6PK STRL BLUE (TOWEL DISPOSABLE) ×2 IMPLANT
TOWEL OR 17X26 10 PK STRL BLUE (TOWEL DISPOSABLE) ×2 IMPLANT
TRAP SPECIMEN MUCOUS 40CC (MISCELLANEOUS) IMPLANT
TRAY FOLEY CATH 14FRSI W/METER (CATHETERS) ×2 IMPLANT
WATER STERILE IRR 1000ML POUR (IV SOLUTION) ×2 IMPLANT

## 2013-04-22 NOTE — H&P (Signed)
Subjective: Patient is a 52 y.o. male admitted for T8-9 fusion. Onset of symptoms was several months ago, gradually worsening since that time.  The pain is rated severe, and is located at the across the mid-thoracic back and radiates to ribs. The pain is described as aching and occurs all day. The symptoms have been progressive. Symptoms are exacerbated by exercise. 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  Past Medical History  Diagnosis Date  . Sleep apnea     wears nightly - cpap   . Diabetes mellitus without complication     diet controlled. fasting 90-100  . Anemia     Past Surgical History  Procedure Laterality Date  . Back surgery      t10-l2 fusion  . Carpel tunnel Bilateral   . Cyst removal hand    . Esophagogastroduodenoscopy N/A 01/25/2013    Procedure: ESOPHAGOGASTRODUODENOSCOPY (EGD);  Surgeon: Charolett Bumpers, MD;  Location: [facility];  Service: Endoscopy;  Laterality: N/A;  . Colonoscopy N/A 03/08/2013    Procedure: COLONOSCOPY;  Surgeon: Charolett Bumpers, MD;  Location: [facility];  Service: Endoscopy;  Laterality: N/A;    Prior to Admission medications   Medication Sig Start Date End Date Taking? Authorizing Provider  Calcium Carbonate-Vitamin D (CALCIUM 600 + D PO) Take 1 tablet by mouth daily.   Yes Historical Provider, MD  ergocalciferol (VITAMIN D2) 50000 UNITS capsule Take 50,000 Units by mouth once a week. Take on Thursdays   Yes Historical Provider, MD  nortriptyline (PAMELOR) 75 MG capsule Take 75 mg by mouth daily.   Yes Historical Provider, MD  oxybutynin (DITROPAN XL) 15 MG 24 hr tablet Take 15 mg by mouth daily.   Yes Historical Provider, MD  sulfamethoxazole-trimethoprim (BACTRIM DS) 800-160 MG per tablet Take 1 tablet by mouth once.   Yes Historical Provider, MD   Allergies  Allergen Reactions  . Bee Venom Swelling    History  Substance Use Topics  . Smoking status: Never Smoker   . Smokeless tobacco: Never Used  . Alcohol Use: No     History reviewed. No pertinent family history.   Review of Systems  Positive ROS: neg  All other systems have been reviewed and were otherwise negative with the exception of those mentioned in the HPI and as above.  Objective: Vital signs in last 24 hours: Temp:  [97.4 F (36.3 C)] 97.4 F (36.3 C) (11/07 0929) Pulse Rate:  [77] 77 (11/07 0929) Resp:  [18] 18 (11/07 0929) BP: (131)/(78) 131/78 mmHg (11/07 0929) SpO2:  [98 %] 98 % (11/07 0929)  General Appearance: Alert, cooperative, no distress, appears stated age Head: Normocephalic, without obvious abnormality, atraumatic Eyes: PERRL, conjunctiva/corneas clear, EOM's intact    Neck: Supple, symmetrical, trachea midline Lungs:  respirations unlabored Heart: Regular rate and rhythm Abdomen: Soft, non-tender  NEUROLOGIC:   Mental status: Alert and oriented x4,  no aphasia, good attention span, fund of knowledge, and memory Motor Exam - grossly normal except for stable old paraplegia Sensory Exam - grossly normal in UE Reflexes: normal in UE Coordination - grossly normal in UE Gait - paraplegic Balance - paraplegic Cranial Nerves: I: smell Not tested  II: visual acuity  OS: nl    OD: nl  II: visual fields Full to confrontation  II: pupils Equal, round, reactive to light  III,VII: ptosis None  III,IV,VI: extraocular muscles  Full ROM  V: mastication Normal  V: facial light touch sensation  Normal  V,VII: corneal reflex  Present  VII: facial muscle function - upper  Normal  VII: facial muscle function - lower Normal  VIII: hearing Not tested  IX: soft palate elevation  Normal  IX,X: gag reflex Present  XI: trapezius strength  5/5  XI: sternocleidomastoid strength 5/5  XI: neck flexion strength  5/5  XII: tongue strength  Normal    Data Review Lab Results  Component Value Date   WBC 5.3 04/15/2013   HGB 12.5* 04/15/2013   HCT 37.5* 04/15/2013   MCV 86.8 04/15/2013   PLT 211 04/15/2013   Lab Results   Component Value Date   NA 139 04/15/2013   K 4.0 04/15/2013   CL 105 04/15/2013   CO2 24 04/15/2013   BUN 11 04/15/2013   CREATININE 0.69 04/15/2013   GLUCOSE 108* 04/15/2013   Lab Results  Component Value Date   INR 1.02 04/15/2013    Assessment/Plan: Patient admitted for T8-9 fusion. Patient has failed a reasonable attempt at conservative therapy.  I explained the condition and procedure to the patient and answered any questions.  Patient wishes to proceed with procedure as planned. Understands risks/ benefits and typical outcomes of procedure.   Warrick Llera S 04/22/2013 10:28 AM

## 2013-04-22 NOTE — Anesthesia Preprocedure Evaluation (Addendum)
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 negative psych ROS   GI/Hepatic negative GI ROS, Neg liver ROS,   Endo/Other  diabetes  Renal/GU negative Renal ROS  negative genitourinary   Musculoskeletal   Abdominal   Peds  Hematology  (+) anemia ,   Anesthesia Other Findings See surgeon's H&P   T-12 paraplgia  Reproductive/Obstetrics negative OB ROS                         Anesthesia Physical Anesthesia Plan  ASA: II  Anesthesia Plan: General   Post-op Pain Management:    Induction: Intravenous  Airway Management Planned: Oral ETT  Additional Equipment:   Intra-op Plan:   Post-operative Plan: Extubation in OR  Informed Consent: I have reviewed the patients History and Physical, chart, labs and discussed the procedure including the risks, benefits and alternatives for the proposed anesthesia with the patient or authorized representative who has indicated his/her understanding and acceptance.   Dental Advisory Given  Plan Discussed with: CRNA and Surgeon  Anesthesia Plan Comments:         Anesthesia Quick Evaluation
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
## 2013-04-22 NOTE — Progress Notes (Signed)
Pt difficult IV stick.  Multiple sticks before D5NS hung.  Transporter here to take pt to neuro holding.  Neuro holding to recheck CBG.  Pt asymptomatic.

## 2013-04-22 NOTE — Progress Notes (Signed)
Pt's CBG 63, anesthesia notified, will hang D5NS per verbal order.

## 2013-04-22 NOTE — Transfer of Care (Signed)
Immediate Anesthesia Transfer of Care Note  Patient: David Odom  Procedure(s) Performed: Procedure(s): Thoracic Eight Nine Fusion w/pedicle screws (N/A)  Patient Location: PACU  Anesthesia Type:General  Level of Consciousness: awake, alert  and oriented  Airway & Oxygen Therapy: Patient Spontanous Breathing and Patient connected to nasal cannula oxygen  Post-op Assessment: Report given to PACU RN, Post -op Vital signs reviewed and stable and Patient moving all extremities X 4  Post vital signs: Reviewed and stable  Complications: No apparent anesthesia complications

## 2013-04-22 NOTE — Anesthesia Procedure Notes (Signed)
Procedure Name: Intubation Date/Time: 04/22/2013 11:26 AM Performed by: Lanell Matar Pre-anesthesia Checklist: Patient identified, Timeout performed, Emergency Drugs available, Suction available and Patient being monitored Patient Re-evaluated:Patient Re-evaluated prior to inductionOxygen Delivery Method: Circle system utilized Preoxygenation: Pre-oxygenation with 100% oxygen Intubation Type: IV induction Ventilation: Mask ventilation without difficulty and Oral airway inserted - appropriate to patient size Laryngoscope Size: Hyacinth Meeker and 2 Grade View: Grade I Tube type: Oral Tube size: 7.5 mm Number of attempts: 1 Airway Equipment and Method: Stylet Placement Confirmation: ETT inserted through vocal cords under direct vision,  positive ETCO2,  CO2 detector and breath sounds checked- equal and bilateral Secured at: 22 cm Tube secured with: Tape Dental Injury: Teeth and Oropharynx as per pre-operative assessment

## 2013-04-22 NOTE — Progress Notes (Signed)
UR COMPLETED  

## 2013-04-23 LAB — GLUCOSE, CAPILLARY

## 2013-04-23 NOTE — Discharge Summary (Addendum)
  Physician Discharge Summary  Patient ID: David Odom MRN: [id number] DOB/AGE: 1960-09-17 52 y.o.  Admit date: 04/22/2013 Discharge date: 04/23/2013  Admission Diagnoses: T8-9 instability  Discharge Diagnoses: Same Active Problems:   * No active hospital problems. *   Discharged Condition: good  Hospital Course: Patient admitted as an early morning admission went to the operating room underwent a T8-9 fusion with pedicle screws postoperative is a very well recovered in the floor on the floor he was recovering well tolerating regular diet and was stable enough for discharge home.  Consults: Significant Diagnostic Studies: Treatments: T 89 fusion Discharge Exam: Blood pressure 121/60, pulse 100, temperature 97.8 F (36.6 C), temperature source Oral, resp. rate 20, height 5\' 11"  (1.803 m), weight 88.361 kg (194 lb 12.8 oz), SpO2 98.00%. Neurologically nonfocal  Disposition: Home     Medication List         CALCIUM 600 + D PO  Take 1 tablet by mouth daily.     DITROPAN XL 15 MG 24 hr tablet  Generic drug:  oxybutynin  Take 15 mg by mouth daily.     ergocalciferol 50000 UNITS capsule  Commonly known as:  VITAMIN D2  Take 50,000 Units by mouth once a week. Take on Thursdays     nortriptyline 75 MG capsule  Commonly known as:  PAMELOR  Take 75 mg by mouth daily.     sulfamethoxazole-trimethoprim 800-160 MG per tablet  Commonly known as:  BACTRIM DS  Take 1 tablet by mouth once.           Follow-up Information   Follow up with Tia Alert, MD.   Specialty:  Neurosurgery   Contact information:   [address] [place] [address] [phone]       Signed: Bryanne Riquelme P 04/23/2013, 9:27 AM

## 2013-04-23 NOTE — Progress Notes (Signed)
Self cath this AM=150cc's, clear with slight odor. No hematuria observed. 1 vicodin given this AM for "stiffness". David Odom A

## 2013-04-23 NOTE — Progress Notes (Signed)
Performs own I & O cath per home regimen. I & O cath at 0100=742ml and with frank red blood observed. "I must've had a bad cath." Will observe urine from next cath. Without complaint of pain. Denies any burning, numbness, and tingling.David Odom A

## 2013-04-23 NOTE — Progress Notes (Signed)
Patient ID: David Odom, male   DOB: 11/12/1960, 52 y.o.   MRN: [id number] Patient feels great posterior day 1 from T8-9 fusion no significant radicular pain back pains well-controlled patient stable for discharge home.

## 2013-04-27 NOTE — Anesthesia Postprocedure Evaluation (Signed)
Anesthesia Post Note  Patient: David Odom  Procedure(s) Performed: Procedure(s) (LRB): Thoracic Eight Nine Fusion w/pedicle screws (N/A)  Anesthesia type: General  Patient location: PACU  Post pain: Pain level controlled  Post assessment: Patient's Cardiovascular Status Stable  Last Vitals:  Filed Vitals:   04/23/13 0531  BP: 121/60  Pulse: 100  Temp: 36.6 C  Resp: 20    Post vital signs: Reviewed and stable  Level of consciousness: alert  Complications: No apparent anesthesia complications

## 2013-05-04 DIAGNOSIS — L8993 Pressure ulcer of unspecified site, stage 3: Secondary | ICD-10-CM | POA: Diagnosis not present

## 2013-05-04 DIAGNOSIS — L89309 Pressure ulcer of unspecified buttock, unspecified stage: Secondary | ICD-10-CM | POA: Diagnosis not present

## 2013-05-09 DIAGNOSIS — N390 Urinary tract infection, site not specified: Secondary | ICD-10-CM | POA: Diagnosis not present

## 2013-05-18 ENCOUNTER — Encounter (HOSPITAL_BASED_OUTPATIENT_CLINIC_OR_DEPARTMENT_OTHER): Payer: Medicare Other | Attending: General Surgery

## 2013-05-18 DIAGNOSIS — L8993 Pressure ulcer of unspecified site, stage 3: Secondary | ICD-10-CM | POA: Diagnosis not present

## 2013-05-18 DIAGNOSIS — L89309 Pressure ulcer of unspecified buttock, unspecified stage: Secondary | ICD-10-CM | POA: Diagnosis not present

## 2013-06-13 DIAGNOSIS — E669 Obesity, unspecified: Secondary | ICD-10-CM | POA: Diagnosis not present

## 2013-06-13 DIAGNOSIS — M546 Pain in thoracic spine: Secondary | ICD-10-CM | POA: Diagnosis not present

## 2013-06-20 DIAGNOSIS — R310 Gross hematuria: Secondary | ICD-10-CM | POA: Diagnosis not present

## 2013-06-20 DIAGNOSIS — N135 Crossing vessel and stricture of ureter without hydronephrosis: Secondary | ICD-10-CM | POA: Diagnosis not present

## 2013-06-22 DIAGNOSIS — R339 Retention of urine, unspecified: Secondary | ICD-10-CM | POA: Diagnosis not present

## 2013-06-22 DIAGNOSIS — R310 Gross hematuria: Secondary | ICD-10-CM | POA: Diagnosis not present

## 2013-06-22 DIAGNOSIS — N319 Neuromuscular dysfunction of bladder, unspecified: Secondary | ICD-10-CM | POA: Diagnosis not present

## 2013-07-05 DIAGNOSIS — R310 Gross hematuria: Secondary | ICD-10-CM | POA: Diagnosis not present

## 2013-07-05 DIAGNOSIS — N319 Neuromuscular dysfunction of bladder, unspecified: Secondary | ICD-10-CM | POA: Diagnosis not present

## 2013-08-10 DIAGNOSIS — R339 Retention of urine, unspecified: Secondary | ICD-10-CM | POA: Diagnosis not present

## 2013-08-10 DIAGNOSIS — N319 Neuromuscular dysfunction of bladder, unspecified: Secondary | ICD-10-CM | POA: Diagnosis not present

## 2013-09-05 DIAGNOSIS — M546 Pain in thoracic spine: Secondary | ICD-10-CM | POA: Diagnosis not present

## 2013-09-05 DIAGNOSIS — Z6825 Body mass index (BMI) 25.0-25.9, adult: Secondary | ICD-10-CM | POA: Diagnosis not present

## 2013-10-19 DIAGNOSIS — E119 Type 2 diabetes mellitus without complications: Secondary | ICD-10-CM | POA: Diagnosis not present

## 2013-10-19 DIAGNOSIS — G4733 Obstructive sleep apnea (adult) (pediatric): Secondary | ICD-10-CM | POA: Diagnosis not present

## 2013-10-19 DIAGNOSIS — D509 Iron deficiency anemia, unspecified: Secondary | ICD-10-CM | POA: Diagnosis not present

## 2013-10-19 DIAGNOSIS — G8220 Paraplegia, unspecified: Secondary | ICD-10-CM | POA: Diagnosis not present

## 2014-01-13 DIAGNOSIS — G4733 Obstructive sleep apnea (adult) (pediatric): Secondary | ICD-10-CM | POA: Diagnosis not present

## 2014-03-27 DIAGNOSIS — M779 Enthesopathy, unspecified: Secondary | ICD-10-CM | POA: Diagnosis not present

## 2014-03-27 DIAGNOSIS — L601 Onycholysis: Secondary | ICD-10-CM | POA: Diagnosis not present

## 2014-03-27 DIAGNOSIS — E559 Vitamin D deficiency, unspecified: Secondary | ICD-10-CM | POA: Diagnosis not present

## 2014-03-27 DIAGNOSIS — Z0000 Encounter for general adult medical examination without abnormal findings: Secondary | ICD-10-CM | POA: Diagnosis not present

## 2014-03-27 DIAGNOSIS — Z23 Encounter for immunization: Secondary | ICD-10-CM | POA: Diagnosis not present

## 2014-03-27 DIAGNOSIS — E119 Type 2 diabetes mellitus without complications: Secondary | ICD-10-CM | POA: Diagnosis not present

## 2014-03-27 DIAGNOSIS — D509 Iron deficiency anemia, unspecified: Secondary | ICD-10-CM | POA: Diagnosis not present

## 2014-04-11 ENCOUNTER — Ambulatory Visit: Payer: Self-pay

## 2014-04-11 ENCOUNTER — Encounter: Payer: Self-pay | Admitting: Podiatry

## 2014-04-11 ENCOUNTER — Ambulatory Visit (INDEPENDENT_AMBULATORY_CARE_PROVIDER_SITE_OTHER): Payer: Medicare Other | Admitting: Podiatry

## 2014-04-11 VITALS — BP 120/70 | HR 94 | Resp 16

## 2014-04-11 DIAGNOSIS — L603 Nail dystrophy: Secondary | ICD-10-CM

## 2014-04-11 NOTE — Progress Notes (Signed)
   Subjective:    Patient ID: David Odom, male    DOB: 02-08-1961, 53 y.o.   MRN: [id number]  HPI Comments: "I have toenail fungus and some knots on my toes"  Patient c/o thick, dark nails bilateral for several years. He's tried lots of OTC meds (sprays, creams, tea tree oil) and nothing has helped.   Also says he feels knots in his toes. He has no feeling in his feet and legs. Patient is in a wheelchair.     Review of Systems  All other systems reviewed and are negative.      Objective:   Physical Exam: I have reviewed his past mental history medications allergy surgery social history. Pulses are palpable bilateral there is considerable edema bilateral. He is a paraplegic with no functional tone or sensation to the bilateral foot. Orthopedic evaluation appears to be normal. Toenails demonstrate Kolionychia hallux bilateral. Otherwise a nail dystrophy is all that he has. No open wounds no erythema saline as drainage or odor.        Assessment & Plan:  Assessment: Paraplegia with dystrophic toenails hallux bilateral.  Plan: Debrided nails 1 through 5 bilateral.

## 2014-07-05 DIAGNOSIS — G8220 Paraplegia, unspecified: Secondary | ICD-10-CM | POA: Diagnosis not present

## 2014-07-05 DIAGNOSIS — L89153 Pressure ulcer of sacral region, stage 3: Secondary | ICD-10-CM | POA: Diagnosis not present

## 2014-07-09 DIAGNOSIS — G8220 Paraplegia, unspecified: Secondary | ICD-10-CM | POA: Diagnosis not present

## 2014-07-09 DIAGNOSIS — K922 Gastrointestinal hemorrhage, unspecified: Secondary | ICD-10-CM | POA: Diagnosis not present

## 2014-07-09 DIAGNOSIS — E119 Type 2 diabetes mellitus without complications: Secondary | ICD-10-CM | POA: Diagnosis not present

## 2014-07-09 DIAGNOSIS — Z936 Other artificial openings of urinary tract status: Secondary | ICD-10-CM | POA: Diagnosis not present

## 2014-07-09 DIAGNOSIS — L89323 Pressure ulcer of left buttock, stage 3: Secondary | ICD-10-CM | POA: Diagnosis not present

## 2014-07-09 DIAGNOSIS — M810 Age-related osteoporosis without current pathological fracture: Secondary | ICD-10-CM | POA: Diagnosis not present

## 2014-07-12 DIAGNOSIS — L89323 Pressure ulcer of left buttock, stage 3: Secondary | ICD-10-CM | POA: Diagnosis not present

## 2014-07-12 DIAGNOSIS — K922 Gastrointestinal hemorrhage, unspecified: Secondary | ICD-10-CM | POA: Diagnosis not present

## 2014-07-12 DIAGNOSIS — G8220 Paraplegia, unspecified: Secondary | ICD-10-CM | POA: Diagnosis not present

## 2014-07-12 DIAGNOSIS — M810 Age-related osteoporosis without current pathological fracture: Secondary | ICD-10-CM | POA: Diagnosis not present

## 2014-07-12 DIAGNOSIS — E119 Type 2 diabetes mellitus without complications: Secondary | ICD-10-CM | POA: Diagnosis not present

## 2014-07-12 DIAGNOSIS — Z936 Other artificial openings of urinary tract status: Secondary | ICD-10-CM | POA: Diagnosis not present

## 2014-07-14 ENCOUNTER — Encounter (HOSPITAL_BASED_OUTPATIENT_CLINIC_OR_DEPARTMENT_OTHER): Payer: Medicare Other | Attending: Internal Medicine

## 2014-07-14 DIAGNOSIS — L89152 Pressure ulcer of sacral region, stage 2: Secondary | ICD-10-CM | POA: Insufficient documentation
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
## 2014-07-17 DIAGNOSIS — Z936 Other artificial openings of urinary tract status: Secondary | ICD-10-CM | POA: Diagnosis not present

## 2014-07-17 DIAGNOSIS — K922 Gastrointestinal hemorrhage, unspecified: Secondary | ICD-10-CM | POA: Diagnosis not present

## 2014-07-17 DIAGNOSIS — G8220 Paraplegia, unspecified: Secondary | ICD-10-CM | POA: Diagnosis not present

## 2014-07-17 DIAGNOSIS — M810 Age-related osteoporosis without current pathological fracture: Secondary | ICD-10-CM | POA: Diagnosis not present

## 2014-07-17 DIAGNOSIS — E119 Type 2 diabetes mellitus without complications: Secondary | ICD-10-CM | POA: Diagnosis not present

## 2014-07-17 DIAGNOSIS — L89323 Pressure ulcer of left buttock, stage 3: Secondary | ICD-10-CM | POA: Diagnosis not present

## 2014-07-20 DIAGNOSIS — L89323 Pressure ulcer of left buttock, stage 3: Secondary | ICD-10-CM | POA: Diagnosis not present

## 2014-07-20 DIAGNOSIS — K922 Gastrointestinal hemorrhage, unspecified: Secondary | ICD-10-CM | POA: Diagnosis not present

## 2014-07-20 DIAGNOSIS — E119 Type 2 diabetes mellitus without complications: Secondary | ICD-10-CM | POA: Diagnosis not present

## 2014-07-20 DIAGNOSIS — M810 Age-related osteoporosis without current pathological fracture: Secondary | ICD-10-CM | POA: Diagnosis not present

## 2014-07-20 DIAGNOSIS — G8220 Paraplegia, unspecified: Secondary | ICD-10-CM | POA: Diagnosis not present

## 2014-07-20 DIAGNOSIS — Z936 Other artificial openings of urinary tract status: Secondary | ICD-10-CM | POA: Diagnosis not present

## 2014-07-21 ENCOUNTER — Encounter (HOSPITAL_BASED_OUTPATIENT_CLINIC_OR_DEPARTMENT_OTHER): Payer: Medicare Other | Attending: Internal Medicine

## 2014-07-21 DIAGNOSIS — L89152 Pressure ulcer of sacral region, stage 2: Secondary | ICD-10-CM | POA: Insufficient documentation

## 2014-07-24 ENCOUNTER — Other Ambulatory Visit (HOSPITAL_COMMUNITY)
Admission: RE | Admit: 2014-07-24 | Discharge: 2014-07-24 | Disposition: A | Discharge status: Home / Self Care | Payer: Medicare Other | Source: Ambulatory Visit | Attending: Internal Medicine | Admitting: Internal Medicine

## 2014-07-24 DIAGNOSIS — L89323 Pressure ulcer of left buttock, stage 3: Secondary | ICD-10-CM | POA: Diagnosis not present

## 2014-07-24 DIAGNOSIS — Z936 Other artificial openings of urinary tract status: Secondary | ICD-10-CM | POA: Diagnosis not present

## 2014-07-24 DIAGNOSIS — M810 Age-related osteoporosis without current pathological fracture: Secondary | ICD-10-CM | POA: Diagnosis not present

## 2014-07-24 DIAGNOSIS — K922 Gastrointestinal hemorrhage, unspecified: Secondary | ICD-10-CM | POA: Diagnosis not present

## 2014-07-24 DIAGNOSIS — G8220 Paraplegia, unspecified: Secondary | ICD-10-CM | POA: Diagnosis not present

## 2014-07-24 DIAGNOSIS — E119 Type 2 diabetes mellitus without complications: Secondary | ICD-10-CM | POA: Diagnosis not present

## 2014-07-28 DIAGNOSIS — L89152 Pressure ulcer of sacral region, stage 2: Secondary | ICD-10-CM | POA: Diagnosis not present

## 2014-08-02 DIAGNOSIS — M810 Age-related osteoporosis without current pathological fracture: Secondary | ICD-10-CM | POA: Diagnosis not present

## 2014-08-02 DIAGNOSIS — E119 Type 2 diabetes mellitus without complications: Secondary | ICD-10-CM | POA: Diagnosis not present

## 2014-08-02 DIAGNOSIS — G8220 Paraplegia, unspecified: Secondary | ICD-10-CM | POA: Diagnosis not present

## 2014-08-02 DIAGNOSIS — K922 Gastrointestinal hemorrhage, unspecified: Secondary | ICD-10-CM | POA: Diagnosis not present

## 2014-08-02 DIAGNOSIS — L89323 Pressure ulcer of left buttock, stage 3: Secondary | ICD-10-CM | POA: Diagnosis not present

## 2014-08-02 DIAGNOSIS — Z936 Other artificial openings of urinary tract status: Secondary | ICD-10-CM | POA: Diagnosis not present

## 2014-08-04 DIAGNOSIS — L89152 Pressure ulcer of sacral region, stage 2: Secondary | ICD-10-CM | POA: Diagnosis not present

## 2014-08-09 DIAGNOSIS — K922 Gastrointestinal hemorrhage, unspecified: Secondary | ICD-10-CM | POA: Diagnosis not present

## 2014-08-09 DIAGNOSIS — Z936 Other artificial openings of urinary tract status: Secondary | ICD-10-CM | POA: Diagnosis not present

## 2014-08-09 DIAGNOSIS — G8220 Paraplegia, unspecified: Secondary | ICD-10-CM | POA: Diagnosis not present

## 2014-08-09 DIAGNOSIS — L89323 Pressure ulcer of left buttock, stage 3: Secondary | ICD-10-CM | POA: Diagnosis not present

## 2014-08-09 DIAGNOSIS — E119 Type 2 diabetes mellitus without complications: Secondary | ICD-10-CM | POA: Diagnosis not present

## 2014-08-09 DIAGNOSIS — M810 Age-related osteoporosis without current pathological fracture: Secondary | ICD-10-CM | POA: Diagnosis not present

## 2014-08-14 DIAGNOSIS — N319 Neuromuscular dysfunction of bladder, unspecified: Secondary | ICD-10-CM | POA: Diagnosis not present

## 2014-08-14 DIAGNOSIS — R310 Gross hematuria: Secondary | ICD-10-CM | POA: Diagnosis not present

## 2014-08-14 DIAGNOSIS — R339 Retention of urine, unspecified: Secondary | ICD-10-CM | POA: Diagnosis not present

## 2014-08-15 DIAGNOSIS — Z936 Other artificial openings of urinary tract status: Secondary | ICD-10-CM | POA: Diagnosis not present

## 2014-08-15 DIAGNOSIS — M810 Age-related osteoporosis without current pathological fracture: Secondary | ICD-10-CM | POA: Diagnosis not present

## 2014-08-15 DIAGNOSIS — E119 Type 2 diabetes mellitus without complications: Secondary | ICD-10-CM | POA: Diagnosis not present

## 2014-08-15 DIAGNOSIS — L89323 Pressure ulcer of left buttock, stage 3: Secondary | ICD-10-CM | POA: Diagnosis not present

## 2014-08-15 DIAGNOSIS — G8220 Paraplegia, unspecified: Secondary | ICD-10-CM | POA: Diagnosis not present

## 2014-08-15 DIAGNOSIS — K922 Gastrointestinal hemorrhage, unspecified: Secondary | ICD-10-CM | POA: Diagnosis not present

## 2014-08-18 ENCOUNTER — Encounter (HOSPITAL_BASED_OUTPATIENT_CLINIC_OR_DEPARTMENT_OTHER): Payer: Medicare Other | Attending: Internal Medicine

## 2014-08-18 DIAGNOSIS — L89153 Pressure ulcer of sacral region, stage 3: Secondary | ICD-10-CM | POA: Diagnosis not present

## 2014-08-22 DIAGNOSIS — E119 Type 2 diabetes mellitus without complications: Secondary | ICD-10-CM | POA: Diagnosis not present

## 2014-08-22 DIAGNOSIS — K922 Gastrointestinal hemorrhage, unspecified: Secondary | ICD-10-CM | POA: Diagnosis not present

## 2014-08-22 DIAGNOSIS — G8220 Paraplegia, unspecified: Secondary | ICD-10-CM | POA: Diagnosis not present

## 2014-08-22 DIAGNOSIS — Z936 Other artificial openings of urinary tract status: Secondary | ICD-10-CM | POA: Diagnosis not present

## 2014-08-22 DIAGNOSIS — M810 Age-related osteoporosis without current pathological fracture: Secondary | ICD-10-CM | POA: Diagnosis not present

## 2014-08-22 DIAGNOSIS — L89323 Pressure ulcer of left buttock, stage 3: Secondary | ICD-10-CM | POA: Diagnosis not present

## 2014-08-30 DIAGNOSIS — L89323 Pressure ulcer of left buttock, stage 3: Secondary | ICD-10-CM | POA: Diagnosis not present

## 2014-08-30 DIAGNOSIS — Z936 Other artificial openings of urinary tract status: Secondary | ICD-10-CM | POA: Diagnosis not present

## 2014-08-30 DIAGNOSIS — M810 Age-related osteoporosis without current pathological fracture: Secondary | ICD-10-CM | POA: Diagnosis not present

## 2014-08-30 DIAGNOSIS — G8220 Paraplegia, unspecified: Secondary | ICD-10-CM | POA: Diagnosis not present

## 2014-08-30 DIAGNOSIS — K922 Gastrointestinal hemorrhage, unspecified: Secondary | ICD-10-CM | POA: Diagnosis not present

## 2014-08-30 DIAGNOSIS — E119 Type 2 diabetes mellitus without complications: Secondary | ICD-10-CM | POA: Diagnosis not present

## 2014-09-01 DIAGNOSIS — L89153 Pressure ulcer of sacral region, stage 3: Secondary | ICD-10-CM | POA: Diagnosis not present

## 2014-09-05 DIAGNOSIS — G8220 Paraplegia, unspecified: Secondary | ICD-10-CM | POA: Diagnosis not present

## 2014-09-05 DIAGNOSIS — K922 Gastrointestinal hemorrhage, unspecified: Secondary | ICD-10-CM | POA: Diagnosis not present

## 2014-09-05 DIAGNOSIS — M810 Age-related osteoporosis without current pathological fracture: Secondary | ICD-10-CM | POA: Diagnosis not present

## 2014-09-05 DIAGNOSIS — L89323 Pressure ulcer of left buttock, stage 3: Secondary | ICD-10-CM | POA: Diagnosis not present

## 2014-09-05 DIAGNOSIS — Z936 Other artificial openings of urinary tract status: Secondary | ICD-10-CM | POA: Diagnosis not present

## 2014-09-05 DIAGNOSIS — E119 Type 2 diabetes mellitus without complications: Secondary | ICD-10-CM | POA: Diagnosis not present

## 2014-09-07 DIAGNOSIS — Z936 Other artificial openings of urinary tract status: Secondary | ICD-10-CM | POA: Diagnosis not present

## 2014-09-07 DIAGNOSIS — L89323 Pressure ulcer of left buttock, stage 3: Secondary | ICD-10-CM | POA: Diagnosis not present

## 2014-09-07 DIAGNOSIS — G8220 Paraplegia, unspecified: Secondary | ICD-10-CM | POA: Diagnosis not present

## 2014-09-07 DIAGNOSIS — E119 Type 2 diabetes mellitus without complications: Secondary | ICD-10-CM | POA: Diagnosis not present

## 2014-09-07 DIAGNOSIS — M810 Age-related osteoporosis without current pathological fracture: Secondary | ICD-10-CM | POA: Diagnosis not present

## 2014-09-07 DIAGNOSIS — K922 Gastrointestinal hemorrhage, unspecified: Secondary | ICD-10-CM | POA: Diagnosis not present

## 2014-09-08 DIAGNOSIS — L89153 Pressure ulcer of sacral region, stage 3: Secondary | ICD-10-CM | POA: Diagnosis not present

## 2014-09-13 DIAGNOSIS — L89323 Pressure ulcer of left buttock, stage 3: Secondary | ICD-10-CM | POA: Diagnosis not present

## 2014-09-13 DIAGNOSIS — M810 Age-related osteoporosis without current pathological fracture: Secondary | ICD-10-CM | POA: Diagnosis not present

## 2014-09-13 DIAGNOSIS — G8220 Paraplegia, unspecified: Secondary | ICD-10-CM | POA: Diagnosis not present

## 2014-09-13 DIAGNOSIS — E119 Type 2 diabetes mellitus without complications: Secondary | ICD-10-CM | POA: Diagnosis not present

## 2014-09-13 DIAGNOSIS — Z936 Other artificial openings of urinary tract status: Secondary | ICD-10-CM | POA: Diagnosis not present

## 2014-09-13 DIAGNOSIS — K922 Gastrointestinal hemorrhage, unspecified: Secondary | ICD-10-CM | POA: Diagnosis not present

## 2014-09-14 DIAGNOSIS — E119 Type 2 diabetes mellitus without complications: Secondary | ICD-10-CM | POA: Diagnosis not present

## 2014-09-14 DIAGNOSIS — L89323 Pressure ulcer of left buttock, stage 3: Secondary | ICD-10-CM | POA: Diagnosis not present

## 2014-09-14 DIAGNOSIS — G8220 Paraplegia, unspecified: Secondary | ICD-10-CM | POA: Diagnosis not present

## 2014-09-14 DIAGNOSIS — K922 Gastrointestinal hemorrhage, unspecified: Secondary | ICD-10-CM | POA: Diagnosis not present

## 2014-09-14 DIAGNOSIS — M810 Age-related osteoporosis without current pathological fracture: Secondary | ICD-10-CM | POA: Diagnosis not present

## 2014-09-14 DIAGNOSIS — Z936 Other artificial openings of urinary tract status: Secondary | ICD-10-CM | POA: Diagnosis not present

## 2014-09-20 DIAGNOSIS — E119 Type 2 diabetes mellitus without complications: Secondary | ICD-10-CM | POA: Diagnosis not present

## 2014-09-20 DIAGNOSIS — G8220 Paraplegia, unspecified: Secondary | ICD-10-CM | POA: Diagnosis not present

## 2014-09-20 DIAGNOSIS — K922 Gastrointestinal hemorrhage, unspecified: Secondary | ICD-10-CM | POA: Diagnosis not present

## 2014-09-20 DIAGNOSIS — Z936 Other artificial openings of urinary tract status: Secondary | ICD-10-CM | POA: Diagnosis not present

## 2014-09-20 DIAGNOSIS — L89323 Pressure ulcer of left buttock, stage 3: Secondary | ICD-10-CM | POA: Diagnosis not present

## 2014-09-20 DIAGNOSIS — M810 Age-related osteoporosis without current pathological fracture: Secondary | ICD-10-CM | POA: Diagnosis not present

## 2014-09-22 ENCOUNTER — Encounter (HOSPITAL_BASED_OUTPATIENT_CLINIC_OR_DEPARTMENT_OTHER): Payer: Medicare Other | Attending: Internal Medicine

## 2014-09-22 DIAGNOSIS — L89153 Pressure ulcer of sacral region, stage 3: Secondary | ICD-10-CM | POA: Insufficient documentation

## 2014-09-25 DIAGNOSIS — R339 Retention of urine, unspecified: Secondary | ICD-10-CM | POA: Diagnosis not present

## 2014-09-27 DIAGNOSIS — G8220 Paraplegia, unspecified: Secondary | ICD-10-CM | POA: Diagnosis not present

## 2014-09-27 DIAGNOSIS — E119 Type 2 diabetes mellitus without complications: Secondary | ICD-10-CM | POA: Diagnosis not present

## 2014-09-27 DIAGNOSIS — Z936 Other artificial openings of urinary tract status: Secondary | ICD-10-CM | POA: Diagnosis not present

## 2014-09-27 DIAGNOSIS — K922 Gastrointestinal hemorrhage, unspecified: Secondary | ICD-10-CM | POA: Diagnosis not present

## 2014-09-27 DIAGNOSIS — M810 Age-related osteoporosis without current pathological fracture: Secondary | ICD-10-CM | POA: Diagnosis not present

## 2014-09-27 DIAGNOSIS — L89323 Pressure ulcer of left buttock, stage 3: Secondary | ICD-10-CM | POA: Diagnosis not present

## 2014-10-03 DIAGNOSIS — L89323 Pressure ulcer of left buttock, stage 3: Secondary | ICD-10-CM | POA: Diagnosis not present

## 2014-10-03 DIAGNOSIS — G8220 Paraplegia, unspecified: Secondary | ICD-10-CM | POA: Diagnosis not present

## 2014-10-03 DIAGNOSIS — K922 Gastrointestinal hemorrhage, unspecified: Secondary | ICD-10-CM | POA: Diagnosis not present

## 2014-10-03 DIAGNOSIS — E119 Type 2 diabetes mellitus without complications: Secondary | ICD-10-CM | POA: Diagnosis not present

## 2014-10-03 DIAGNOSIS — Z936 Other artificial openings of urinary tract status: Secondary | ICD-10-CM | POA: Diagnosis not present

## 2014-10-03 DIAGNOSIS — M810 Age-related osteoporosis without current pathological fracture: Secondary | ICD-10-CM | POA: Diagnosis not present

## 2014-10-06 DIAGNOSIS — L89153 Pressure ulcer of sacral region, stage 3: Secondary | ICD-10-CM | POA: Diagnosis not present

## 2014-10-10 DIAGNOSIS — Z936 Other artificial openings of urinary tract status: Secondary | ICD-10-CM | POA: Diagnosis not present

## 2014-10-10 DIAGNOSIS — G8220 Paraplegia, unspecified: Secondary | ICD-10-CM | POA: Diagnosis not present

## 2014-10-10 DIAGNOSIS — M810 Age-related osteoporosis without current pathological fracture: Secondary | ICD-10-CM | POA: Diagnosis not present

## 2014-10-10 DIAGNOSIS — K922 Gastrointestinal hemorrhage, unspecified: Secondary | ICD-10-CM | POA: Diagnosis not present

## 2014-10-10 DIAGNOSIS — E119 Type 2 diabetes mellitus without complications: Secondary | ICD-10-CM | POA: Diagnosis not present

## 2014-10-10 DIAGNOSIS — L89323 Pressure ulcer of left buttock, stage 3: Secondary | ICD-10-CM | POA: Diagnosis not present

## 2014-10-11 DIAGNOSIS — R8290 Unspecified abnormal findings in urine: Secondary | ICD-10-CM | POA: Diagnosis not present

## 2014-10-17 DIAGNOSIS — M810 Age-related osteoporosis without current pathological fracture: Secondary | ICD-10-CM | POA: Diagnosis not present

## 2014-10-17 DIAGNOSIS — K922 Gastrointestinal hemorrhage, unspecified: Secondary | ICD-10-CM | POA: Diagnosis not present

## 2014-10-17 DIAGNOSIS — Z936 Other artificial openings of urinary tract status: Secondary | ICD-10-CM | POA: Diagnosis not present

## 2014-10-17 DIAGNOSIS — E119 Type 2 diabetes mellitus without complications: Secondary | ICD-10-CM | POA: Diagnosis not present

## 2014-10-17 DIAGNOSIS — G8220 Paraplegia, unspecified: Secondary | ICD-10-CM | POA: Diagnosis not present

## 2014-10-17 DIAGNOSIS — L89323 Pressure ulcer of left buttock, stage 3: Secondary | ICD-10-CM | POA: Diagnosis not present

## 2014-10-20 ENCOUNTER — Encounter (HOSPITAL_BASED_OUTPATIENT_CLINIC_OR_DEPARTMENT_OTHER): Payer: Medicare Other | Attending: Internal Medicine

## 2014-10-20 DIAGNOSIS — L89152 Pressure ulcer of sacral region, stage 2: Secondary | ICD-10-CM | POA: Diagnosis not present

## 2014-10-25 DIAGNOSIS — L89323 Pressure ulcer of left buttock, stage 3: Secondary | ICD-10-CM | POA: Diagnosis not present

## 2014-10-25 DIAGNOSIS — G8220 Paraplegia, unspecified: Secondary | ICD-10-CM | POA: Diagnosis not present

## 2014-10-25 DIAGNOSIS — E119 Type 2 diabetes mellitus without complications: Secondary | ICD-10-CM | POA: Diagnosis not present

## 2014-10-25 DIAGNOSIS — Z936 Other artificial openings of urinary tract status: Secondary | ICD-10-CM | POA: Diagnosis not present

## 2014-10-25 DIAGNOSIS — K922 Gastrointestinal hemorrhage, unspecified: Secondary | ICD-10-CM | POA: Diagnosis not present

## 2014-10-25 DIAGNOSIS — M810 Age-related osteoporosis without current pathological fracture: Secondary | ICD-10-CM | POA: Diagnosis not present

## 2014-10-31 DIAGNOSIS — Z936 Other artificial openings of urinary tract status: Secondary | ICD-10-CM | POA: Diagnosis not present

## 2014-10-31 DIAGNOSIS — M810 Age-related osteoporosis without current pathological fracture: Secondary | ICD-10-CM | POA: Diagnosis not present

## 2014-10-31 DIAGNOSIS — L89323 Pressure ulcer of left buttock, stage 3: Secondary | ICD-10-CM | POA: Diagnosis not present

## 2014-10-31 DIAGNOSIS — K922 Gastrointestinal hemorrhage, unspecified: Secondary | ICD-10-CM | POA: Diagnosis not present

## 2014-10-31 DIAGNOSIS — E119 Type 2 diabetes mellitus without complications: Secondary | ICD-10-CM | POA: Diagnosis not present

## 2014-10-31 DIAGNOSIS — G8220 Paraplegia, unspecified: Secondary | ICD-10-CM | POA: Diagnosis not present

## 2014-11-29 DIAGNOSIS — N319 Neuromuscular dysfunction of bladder, unspecified: Secondary | ICD-10-CM | POA: Diagnosis not present

## 2014-11-29 DIAGNOSIS — R339 Retention of urine, unspecified: Secondary | ICD-10-CM | POA: Diagnosis not present

## 2014-12-01 DIAGNOSIS — R339 Retention of urine, unspecified: Secondary | ICD-10-CM | POA: Diagnosis not present

## 2014-12-01 DIAGNOSIS — N319 Neuromuscular dysfunction of bladder, unspecified: Secondary | ICD-10-CM | POA: Diagnosis not present

## 2014-12-01 DIAGNOSIS — R32 Unspecified urinary incontinence: Secondary | ICD-10-CM | POA: Diagnosis not present

## 2014-12-11 DIAGNOSIS — R339 Retention of urine, unspecified: Secondary | ICD-10-CM | POA: Diagnosis not present

## 2014-12-11 DIAGNOSIS — N319 Neuromuscular dysfunction of bladder, unspecified: Secondary | ICD-10-CM | POA: Diagnosis not present

## 2014-12-21 DIAGNOSIS — Z86010 Personal history of colonic polyps: Secondary | ICD-10-CM | POA: Diagnosis not present

## 2014-12-21 DIAGNOSIS — K648 Other hemorrhoids: Secondary | ICD-10-CM | POA: Diagnosis not present

## 2015-03-29 DIAGNOSIS — Z1389 Encounter for screening for other disorder: Secondary | ICD-10-CM | POA: Diagnosis not present

## 2015-03-29 DIAGNOSIS — E119 Type 2 diabetes mellitus without complications: Secondary | ICD-10-CM | POA: Diagnosis not present

## 2015-03-29 DIAGNOSIS — D509 Iron deficiency anemia, unspecified: Secondary | ICD-10-CM | POA: Diagnosis not present

## 2015-03-29 DIAGNOSIS — M810 Age-related osteoporosis without current pathological fracture: Secondary | ICD-10-CM | POA: Diagnosis not present

## 2015-03-29 DIAGNOSIS — Z23 Encounter for immunization: Secondary | ICD-10-CM | POA: Diagnosis not present

## 2015-03-29 DIAGNOSIS — Z0000 Encounter for general adult medical examination without abnormal findings: Secondary | ICD-10-CM | POA: Diagnosis not present

## 2015-03-29 DIAGNOSIS — G4733 Obstructive sleep apnea (adult) (pediatric): Secondary | ICD-10-CM | POA: Diagnosis not present

## 2015-03-29 DIAGNOSIS — Z125 Encounter for screening for malignant neoplasm of prostate: Secondary | ICD-10-CM | POA: Diagnosis not present

## 2015-03-29 DIAGNOSIS — G8220 Paraplegia, unspecified: Secondary | ICD-10-CM | POA: Diagnosis not present

## 2015-05-28 DIAGNOSIS — R32 Unspecified urinary incontinence: Secondary | ICD-10-CM | POA: Diagnosis not present

## 2015-06-26 DIAGNOSIS — N390 Urinary tract infection, site not specified: Secondary | ICD-10-CM | POA: Diagnosis not present

## 2015-07-12 DIAGNOSIS — M8589 Other specified disorders of bone density and structure, multiple sites: Secondary | ICD-10-CM | POA: Diagnosis not present

## 2015-09-27 DIAGNOSIS — G4733 Obstructive sleep apnea (adult) (pediatric): Secondary | ICD-10-CM | POA: Diagnosis not present

## 2015-12-06 DIAGNOSIS — N3000 Acute cystitis without hematuria: Secondary | ICD-10-CM | POA: Diagnosis not present

## 2015-12-11 ENCOUNTER — Encounter (HOSPITAL_COMMUNITY): Payer: Self-pay | Admitting: Emergency Medicine

## 2015-12-11 ENCOUNTER — Emergency Department (HOSPITAL_COMMUNITY)
Admission: EM | Admit: 2015-12-11 | Discharge: 2015-12-11 | Disposition: A | Discharge status: Home / Self Care | Payer: Medicare Other | Attending: Emergency Medicine | Admitting: Emergency Medicine

## 2015-12-11 ENCOUNTER — Emergency Department (HOSPITAL_COMMUNITY): Payer: Medicare Other

## 2015-12-11 ENCOUNTER — Emergency Department (HOSPITAL_BASED_OUTPATIENT_CLINIC_OR_DEPARTMENT_OTHER): Admit: 2015-12-11 | Discharge: 2015-12-11 | Disposition: A | Discharge status: Home / Self Care | Payer: Medicare Other

## 2015-12-11 DIAGNOSIS — Z79899 Other long term (current) drug therapy: Secondary | ICD-10-CM | POA: Insufficient documentation

## 2015-12-11 DIAGNOSIS — R52 Pain, unspecified: Secondary | ICD-10-CM

## 2015-12-11 DIAGNOSIS — I82401 Acute embolism and thrombosis of unspecified deep veins of right lower extremity: Secondary | ICD-10-CM | POA: Diagnosis not present

## 2015-12-11 DIAGNOSIS — M1310 Monoarthritis, not elsewhere classified, unspecified site: Secondary | ICD-10-CM | POA: Diagnosis not present

## 2015-12-11 DIAGNOSIS — M25461 Effusion, right knee: Secondary | ICD-10-CM | POA: Diagnosis present

## 2015-12-11 DIAGNOSIS — Z86718 Personal history of other venous thrombosis and embolism: Secondary | ICD-10-CM

## 2015-12-11 DIAGNOSIS — M79604 Pain in right leg: Secondary | ICD-10-CM

## 2015-12-11 DIAGNOSIS — E119 Type 2 diabetes mellitus without complications: Secondary | ICD-10-CM | POA: Diagnosis not present

## 2015-12-11 DIAGNOSIS — M7989 Other specified soft tissue disorders: Secondary | ICD-10-CM | POA: Diagnosis not present

## 2015-12-11 DIAGNOSIS — G8220 Paraplegia, unspecified: Secondary | ICD-10-CM | POA: Diagnosis not present

## 2015-12-11 HISTORY — DX: Personal history of other venous thrombosis and embolism: Z86.718

## 2015-12-11 LAB — PROTIME-INR
INR: 1.09 (ref 0.00–1.49)
PROTHROMBIN TIME: 13.9 s (ref 11.6–15.2)

## 2015-12-11 LAB — CBC WITH DIFFERENTIAL/PLATELET
Basophils Absolute: 0 10*3/uL (ref 0.0–0.1)
Basophils Relative: 1 %
Eosinophils Absolute: 0.2 10*3/uL (ref 0.0–0.7)
Eosinophils Relative: 4 %
HEMATOCRIT: 37.5 % — AB (ref 39.0–52.0)
Hemoglobin: 12.9 g/dL — ABNORMAL LOW (ref 13.0–17.0)
LYMPHS ABS: 1.7 10*3/uL (ref 0.7–4.0)
LYMPHS PCT: 27 %
MCH: 31.5 pg (ref 26.0–34.0)
MCHC: 34.4 g/dL (ref 30.0–36.0)
MCV: 91.5 fL (ref 78.0–100.0)
MONO ABS: 0.5 10*3/uL (ref 0.1–1.0)
MONOS PCT: 8 %
NEUTROS ABS: 3.8 10*3/uL (ref 1.7–7.7)
Neutrophils Relative %: 60 %
Platelets: 287 10*3/uL (ref 150–400)
RBC: 4.1 MIL/uL — ABNORMAL LOW (ref 4.22–5.81)
RDW: 12.4 % (ref 11.5–15.5)
WBC: 6.3 10*3/uL (ref 4.0–10.5)

## 2015-12-11 LAB — BASIC METABOLIC PANEL
ANION GAP: 6 (ref 5–15)
BUN: 14 mg/dL (ref 6–20)
CO2: 25 mmol/L (ref 22–32)
Calcium: 8.7 mg/dL — ABNORMAL LOW (ref 8.9–10.3)
Chloride: 109 mmol/L (ref 101–111)
Creatinine, Ser: 0.54 mg/dL — ABNORMAL LOW (ref 0.61–1.24)
GFR calc Af Amer: >60 mL/min (ref >60)
GFR calc non Af Amer: >60 mL/min (ref >60)
GLUCOSE: 95 mg/dL (ref 65–99)
Potassium: 4.6 mmol/L (ref 3.5–5.1)
Sodium: 140 mmol/L (ref 135–145)

## 2015-12-11 LAB — URINALYSIS, ROUTINE W REFLEX MICROSCOPIC
Bilirubin Urine: NEGATIVE
Glucose, UA: NEGATIVE mg/dL
HGB URINE DIPSTICK: NEGATIVE
KETONES UR: NEGATIVE mg/dL
LEUKOCYTES UA: NEGATIVE
Nitrite: NEGATIVE
PROTEIN: NEGATIVE mg/dL
Specific Gravity, Urine: 1.015 (ref 1.005–1.030)
pH: 7 (ref 5.0–8.0)

## 2015-12-11 LAB — I-STAT CG4 LACTIC ACID, ED: Lactic Acid, Venous: 2.03 mmol/L (ref 0.5–1.9)

## 2015-12-11 MED ORDER — RIVAROXABAN 15 MG PO TABS
15.0000 mg | ORAL_TABLET | Freq: Once | ORAL | Status: AC
  Administered 2015-12-11: 15 mg via ORAL
  Filled 2015-12-11: qty 1

## 2015-12-11 MED ORDER — LIDOCAINE HCL (PF) 1 % IJ SOLN
5.0000 mL | Freq: Once | INTRAMUSCULAR | Status: AC
  Administered 2015-12-11: 5 mL via INTRADERMAL
  Filled 2015-12-11: qty 30

## 2015-12-11 MED ORDER — DEXTROSE 5 % IV SOLN
1.0000 g | Freq: Once | INTRAVENOUS | Status: AC
  Administered 2015-12-11: 1 g via INTRAVENOUS
  Filled 2015-12-11: qty 10

## 2015-12-11 MED ORDER — RIVAROXABAN (XARELTO) EDUCATION KIT FOR DVT/PE PATIENTS
PACK | Freq: Once | Status: AC
  Administered 2015-12-11: 21:00:00
  Filled 2015-12-11: qty 1

## 2015-12-11 MED ORDER — RIVAROXABAN (XARELTO) VTE STARTER PACK (15 & 20 MG): ORAL_TABLET | ORAL | Status: DC

## 2015-12-11 MED ORDER — CEPHALEXIN 500 MG PO CAPS: 500.0000 mg | ORAL_CAPSULE | Freq: Four times a day (QID) | ORAL | Status: DC

## 2015-12-11 NOTE — ED Notes (Signed)
MD AND PA at bedside.

## 2015-12-11 NOTE — ED Notes (Signed)
Notified Dr. Anitra LauthPlunkett and PA Romeo AppleBen Lactic - 2.03

## 2015-12-11 NOTE — Discharge Instructions (Signed)
You were found to have a blood clot in your right leg behind your knee. You will be treated for this with a blood thinner, Xarelto. Please take this medication as prescribed. He will also be treated with antibiotics for possible infection in the skin of your leg. It is important for you to follow-up with your doctor tomorrow for reevaluation and a wound recheck. You have cultures pending of the fluid that we took out of your knee, if any bacteria grow out of that we will call you for treatment. Return to ED for any new or worsening symptoms as we discussed.   Information on my medicine - XARELTO (rivaroxaban)  This medication education was reviewed with me or my healthcare representative as part of my discharge preparation.  The pharmacist that spoke with me during my hospital stay was:  Jamse MeadGadhia, Shilpa Bushee M, [facility]  WHY WAS David HurlXARELTO PRESCRIBED FOR YOU? Xarelto was prescribed to treat blood clots that may have been found in the veins of your legs (deep vein thrombosis) or in your lungs (pulmonary embolism) and to reduce the risk of them occurring again.  What do you need to know about Xarelto? The starting dose is one 15 mg tablet taken TWICE daily with food for the FIRST 21 DAYS then on DAY 22 the dose is changed to one 20 mg tablet taken ONCE A DAY with your evening meal.  DO NOT stop taking Xarelto without talking to the health care provider who prescribed the medication.  Refill your prescription for 20 mg tablets before you run out.  After discharge, you should have regular check-up appointments with your healthcare provider that is prescribing your Xarelto.  In the future your dose may need to be changed if your kidney function changes by a significant amount.  What do you do if you miss a dose? If you are taking Xarelto TWICE DAILY and you miss a dose, take it as soon as you remember. You may take two 15 mg tablets (total 30 mg) at the same time then resume your regularly scheduled 15 mg twice  daily the next day.  If you are taking Xarelto ONCE DAILY and you miss a dose, take it as soon as you remember on the same day then continue your regularly scheduled once daily regimen the next day. Do not take two doses of Xarelto at the same time.   Important Safety Information Xarelto is a blood thinner medicine that can cause bleeding. You should call your healthcare provider right away if you experience any of the following: ? Bleeding from an injury or your nose that does not stop. ? Unusual colored urine (red or dark brown) or unusual colored stools (red or black). ? Unusual bruising for unknown reasons. ? A serious fall or if you hit your head (even if there is no bleeding).  Some medicines may interact with Xarelto and might increase your risk of bleeding while on Xarelto. To help avoid this, consult your healthcare provider or pharmacist prior to using any new prescription or non-prescription medications, including herbals, vitamins, non-steroidal anti-inflammatory drugs (NSAIDs) and supplements.  This website has more information on Xarelto: [email or web address].xarelto.com.

## 2015-12-11 NOTE — Progress Notes (Signed)
EDCM spoke to patient at bedside.  Patient is a praplegic.  Patient lives with his parents, father at bedside.  Patient's father reports patient does everything for himself.  Patient reports he drives.  Patient reports he does not have any home health services.  Patient's wheelchair at bedside.  Patient denies any further need for dme.  No further EDCM needs at this time.

## 2015-12-11 NOTE — Progress Notes (Addendum)
*  Preliminary Results* Right lower extremity venous duplex completed. Right lower extremity is negative for acute deep vein thrombosis. There is evidence of right deep vein thrombosis involving the distal right femoral vein, however it appears to be subacute. There is no evidence of right Baker's cyst.  Preliminary results discussed with Mayme GentaBen Cartner, PA-C.  12/11/2015 7:01 PM  Gertie FeyMichelle Karla Vines, RVT, RDCS, RDMS

## 2015-12-11 NOTE — ED Provider Notes (Signed)
CSN: [id number]     Arrival date & time 12/11/15  1527 History   First MD Initiated Contact with Patient 12/11/15 1645     Chief Complaint  Patient presents with  . Knee Problem     (Consider location/radiation/quality/duration/timing/severity/associated sxs/prior Treatment) HPI David Odom is a 55 y.o. 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 Patient reports symptoms started on Monday morning and have gradually gotten worse. He denies any fevers, chills, chest pain, shortness of breath, cough. He reports being seen at [facility] walk-in clinic, had negative x-rays, but referred to emergency department to rule out septic joint.  Past Medical History  Diagnosis Date  . Sleep apnea     wears nightly - cpap   . Diabetes mellitus without complication (HCC)     diet controlled. fasting 90-100  . Anemia   . Paraplegia following spinal cord injury ([place])     T-12 old injury   Past Surgical History  Procedure Laterality Date  . Back surgery      t10-l2 fusion  . Carpel tunnel Bilateral   . Cyst removal hand    . Esophagogastroduodenoscopy N/A 01/25/2013    Procedure: ESOPHAGOGASTRODUODENOSCOPY (EGD);  Surgeon: Garlan Fair, MD;  Location: [facility];  Service: Endoscopy;  Laterality: N/A;  . Colonoscopy N/A 03/08/2013    Procedure: COLONOSCOPY;  Surgeon: Garlan Fair, MD;  Location: [facility] ENDOSCOPY;  Service: Endoscopy;  Laterality: N/A;   History reviewed. No pertinent family history. Social History  Substance Use Topics  . Smoking status: Never Smoker   . Smokeless tobacco: Never Used  . Alcohol Use: No    Review of Systems A 10 point review of systems was completed and was negative except for pertinent positives and negatives as mentioned in the history of present illness     Allergies  Bee venom  Home Medications   Prior to Admission medications   Medication Sig Start Date  End Date Taking? Authorizing Provider  Calcium Carbonate-Vitamin D (CALCIUM 600 + D PO) Take 1 tablet by mouth daily.   Yes Historical Provider, MD  ciprofloxacin (CIPRO) 500 MG tablet TK 1 T PO BID FOR 10 DAYS 12/06/15  Yes Historical Provider, MD  ferrous sulfate 325 (65 FE) MG EC tablet Take 325 mg by mouth daily with breakfast.   Yes Historical Provider, MD  nortriptyline (PAMELOR) 75 MG capsule Take 75 mg by mouth at bedtime.    Yes Historical Provider, MD  oxybutynin (DITROPAN XL) 15 MG 24 hr tablet Take 15 mg by mouth 2 (two) times daily.    Yes Historical Provider, MD  cephALEXin (KEFLEX) 500 MG capsule Take 1 capsule (500 mg total) by mouth 4 (four) times daily. 12/11/15   Comer Locket, PA-C  ergocalciferol (VITAMIN D2) 50000 UNITS capsule Take 50,000 Units by mouth once a week. Take on Thursdays    Historical Provider, MD  Rivaroxaban 15 & 20 MG TBPK Take as directed on package: Start with one 26m tablet by mouth twice a day with food. On Day 22, switch to one 26mtablet once a day with food. 12/11/15   Deisi Salonga, PA-C   BP 134/76 mmHg  Pulse 86  Temp(Src) 98.4 F (36.9 C) (Oral)  Resp 16  Wt 79.379 kg  SpO2 99% Physical Exam  Constitutional: He is oriented to person, place, and time. He appears well-developed and well-nourished.  HENT:  Head: Normocephalic and  atraumatic.  Mouth/Throat: Oropharynx is clear and moist.  Eyes: Conjunctivae are normal. Pupils are equal, round, and reactive to light. Right eye exhibits no discharge. Left eye exhibits no discharge. No scleral icterus.  Neck: Neck supple.  Cardiovascular: Normal rate, regular rhythm and normal heart sounds.   Pulmonary/Chest: Effort normal and breath sounds normal. No respiratory distress. He has no wheezes. He has no rales.  Abdominal: Soft. There is no tenderness.  Musculoskeletal: He exhibits no tenderness.  Right knee is more swollen, red and tight compared to left knee. Right thigh is also more swollen  and erythematous with some induration along lateral thigh. He does maintain passive range of motion, but does not have sensation in the extremities. Distal pulses intact with brisk cap refill. Significant bilateral lower extremity atrophy. No other abnormalities noted. No inguinal lymphadenopathy.  Neurological: He is alert and oriented to person, place, and time.  Cranial Nerves II-XII grossly intact  Skin: Skin is warm and dry. No rash noted.  Psychiatric: He has a normal mood and affect.  Nursing note and vitals reviewed.   ED Course  .Joint Aspiration/Arthrocentesis Date/Time: 12/11/2015 5:20 PM Performed by: Comer Locket Authorized by: Comer Locket Consent: Verbal consent obtained. Risks and benefits: risks, benefits and alternatives were discussed Consent given by: patient Patient understanding: patient states understanding of the procedure being performed Patient identity confirmed: verbally with patient Time out: Immediately prior to procedure a "time out" was called to verify the correct patient, procedure, equipment, support staff and site/side marked as required. Indications: possible septic joint  Body area: knee Local anesthesia used: yes Local anesthetic: lidocaine 1% without epinephrine Anesthetic total: 2 ml Patient sedated: no Preparation: Patient was prepped and draped in the usual sterile fashion. Needle gauge: 18 G Ultrasound guidance: no Approach: medial Aspirate: bloody Aspirate amount: 2 mL Patient tolerance: Patient tolerated the procedure well with no immediate complications Comments: No significant amount of fluid in joint space. Slightly traumatic and yields bloody sample.   (including critical care time) Labs Review Labs Reviewed  CBC WITH DIFFERENTIAL/PLATELET - Abnormal; Notable for the following:    RBC 4.10 (*)    Hemoglobin 12.9 (*)    HCT 37.5 (*)    All other components within normal limits  BASIC METABOLIC PANEL - Abnormal;  Notable for the following:    Creatinine, Ser 0.54 (*)    Calcium 8.7 (*)    All other components within normal limits  I-STAT CG4 LACTIC ACID, ED - Abnormal; Notable for the following:    Lactic Acid, Venous 2.03 (*)    All other components within normal limits  BODY FLUID CULTURE  CULTURE, BLOOD (ROUTINE X 2)  CULTURE, BLOOD (ROUTINE X 2)  PROTIME-INR  URINALYSIS, ROUTINE W REFLEX MICROSCOPIC (NOT AT [facility])    Imaging Review Dg Knee 1-2 Views Right  12/11/2015  CLINICAL DATA:  Redness and swelling. EXAM: RIGHT KNEE - 1-2 VIEW COMPARISON:  None. FINDINGS: Healed distal femoral and proximal tibial fractures with exuberant callus formation. No cause for acute symptoms identified. IMPRESSION: Healed fractures. Electronically Signed   By: Dorise Bullion III M.D   On: 12/11/2015 19:23   Dg Femur, Min 2 Views Right  12/11/2015  CLINICAL DATA:  Redness and swelling. Paraplegic patient. History of right leg fractures. EXAM: RIGHT FEMUR 2 VIEWS COMPARISON:  None. FINDINGS: Deformity of the distal right femur is consistent with previous fracture and callus formation. Deformity of the proximal tibia is also consistent previous fracture with callus formation.  No acute abnormalities are seen. IMPRESSION: No cause for the patient's symptoms identified. Healed distal right femoral proximal tibial fractures. Electronically Signed   By: Dorise Bullion III M.D   On: 12/11/2015 19:22   I have personally reviewed and evaluated these images and lab results as part of my medical decision-making.   EKG Interpretation None     Meds given in ED:  Medications  lidocaine (PF) (XYLOCAINE) 1 % injection 5 mL (5 mLs Intradermal Given 12/11/15 1743)  cefTRIAXone (ROCEPHIN) 1 g in dextrose 5 % 50 mL IVPB (0 g Intravenous Stopped 12/11/15 2308)  rivaroxaban David Odom) Education Kit for DVT/PE patients ( Does not apply Given 12/11/15 2052)  Rivaroxaban (XARELTO) tablet 15 mg (15 mg Oral Given 12/11/15 2316)     Discharge Medication List as of 12/11/2015  8:18 PM    START taking these medications   Details  cephALEXin (KEFLEX) 500 MG capsule Take 1 capsule (500 mg total) by mouth 4 (four) times daily., Starting 12/11/2015, Until Discontinued, Print    Rivaroxaban 15 & 20 MG TBPK Take as directed on package: Start with one 57m tablet by mouth twice a day with food. On Day 22, switch to one 238mtablet once a day with food., Print       Filed Vitals:   12/11/15 1830 12/11/15 1950 12/11/15 2300 12/11/15 2308  BP: 140/79 128/75 133/73 134/76  Pulse: 95 90 90 86  Temp:      TempSrc:      Resp: _0 Weight:      SpO2: 100% 100% 98% 99%    MDM  Patient sent from walk-in clinic for rule out of septic joint of right knee. Knee is swollen, erythematous. Patient is paraplegic, does not sense pain. He does spend most of his time sitting in his wheelchair. Differential includes septic arthritis, possible DVT, gout, cellulitis. Otherwise, patient appears very well, nontoxic, hemodynamically stable. Plan for arthrocentesis, ultrasound lower extremity. Very small amount of synovial fluid obtained from arthrocentesis, largely blood. We'll send for culture and Gram stain, Crystals. Plain films of the right femur and knee show no acute abnormalities, consistent with previous fractures and callus formation. No free air or other gaseous findings concerning for necrotizing fasciitis. Screening labs are grossly unremarkable, no leukocytosis. He does have a mildly elevated lactic at 2.03-likely nonspecific. He is tolerating oral fluids without difficulty. Ultrasound shows subacute distal femoral-due to presentation, will treat for DVT with Xarelto. Due to patient's lack of sensation, exam is somewhat difficult. Due to spreading erythema and induration in the proximal thigh, will cover for possible cellulitis. Patient will follow up with his PCP tomorrow for reevaluation and wound recheck. He overall appears  well, nontoxic and appropriate for discharge and outpatient follow-up. Discussed strict return precautions. Prior to discharge, I discussed and reviewed this case with my attending, Dr. PfJohnney Killianho also saw and evaluated patient and agrees with plan. Final diagnoses:  Swollen R knee  DVT (deep venous thrombosis), right        BeComer LocketPA-C 12/12/15 1354  MaCharlesetta ShanksMD 12/15/15 1116

## 2015-12-11 NOTE — ED Notes (Signed)
PA at bedside.
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
## 2015-12-15 LAB — BODY FLUID CULTURE
CULTURE: NO GROWTH
Special Requests: NORMAL

## 2015-12-16 LAB — CULTURE, BLOOD (ROUTINE X 2)
CULTURE: NO GROWTH
Culture: NO GROWTH

## 2015-12-24 DIAGNOSIS — I82401 Acute embolism and thrombosis of unspecified deep veins of right lower extremity: Secondary | ICD-10-CM | POA: Diagnosis not present

## 2016-02-04 ENCOUNTER — Encounter (HOSPITAL_COMMUNITY): Payer: Self-pay | Admitting: *Deleted

## 2016-02-04 ENCOUNTER — Inpatient Hospital Stay (HOSPITAL_COMMUNITY)
Admission: EM | Admit: 2016-02-04 | Discharge: 2016-02-06 | DRG: 394 | Disposition: A | Discharge status: Home / Self Care | Payer: Medicare Other | Attending: Family Medicine | Admitting: Family Medicine

## 2016-02-04 DIAGNOSIS — Z792 Long term (current) use of antibiotics: Secondary | ICD-10-CM

## 2016-02-04 DIAGNOSIS — K640 First degree hemorrhoids: Secondary | ICD-10-CM | POA: Diagnosis not present

## 2016-02-04 DIAGNOSIS — D62 Acute posthemorrhagic anemia: Secondary | ICD-10-CM | POA: Diagnosis present

## 2016-02-04 DIAGNOSIS — Z981 Arthrodesis status: Secondary | ICD-10-CM | POA: Diagnosis not present

## 2016-02-04 DIAGNOSIS — R000 Tachycardia, unspecified: Secondary | ICD-10-CM | POA: Diagnosis not present

## 2016-02-04 DIAGNOSIS — G8220 Paraplegia, unspecified: Secondary | ICD-10-CM | POA: Diagnosis not present

## 2016-02-04 DIAGNOSIS — E119 Type 2 diabetes mellitus without complications: Secondary | ICD-10-CM | POA: Diagnosis present

## 2016-02-04 DIAGNOSIS — Z87828 Personal history of other (healed) physical injury and trauma: Secondary | ICD-10-CM | POA: Diagnosis not present

## 2016-02-04 DIAGNOSIS — G4733 Obstructive sleep apnea (adult) (pediatric): Secondary | ICD-10-CM | POA: Diagnosis present

## 2016-02-04 DIAGNOSIS — K644 Residual hemorrhoidal skin tags: Secondary | ICD-10-CM | POA: Diagnosis present

## 2016-02-04 DIAGNOSIS — K625 Hemorrhage of anus and rectum: Secondary | ICD-10-CM

## 2016-02-04 DIAGNOSIS — Z79899 Other long term (current) drug therapy: Secondary | ICD-10-CM | POA: Diagnosis not present

## 2016-02-04 DIAGNOSIS — K648 Other hemorrhoids: Secondary | ICD-10-CM | POA: Diagnosis not present

## 2016-02-04 DIAGNOSIS — Z7901 Long term (current) use of anticoagulants: Secondary | ICD-10-CM

## 2016-02-04 DIAGNOSIS — K922 Gastrointestinal hemorrhage, unspecified: Secondary | ICD-10-CM | POA: Diagnosis not present

## 2016-02-04 DIAGNOSIS — Z86718 Personal history of other venous thrombosis and embolism: Secondary | ICD-10-CM | POA: Diagnosis not present

## 2016-02-04 DIAGNOSIS — D649 Anemia, unspecified: Secondary | ICD-10-CM | POA: Diagnosis not present

## 2016-02-04 DIAGNOSIS — R5382 Chronic fatigue, unspecified: Secondary | ICD-10-CM | POA: Diagnosis not present

## 2016-02-04 LAB — COMPREHENSIVE METABOLIC PANEL
ALT: 18 U/L (ref 17–63)
AST: 20 U/L (ref 15–41)
Albumin: 3.2 g/dL — ABNORMAL LOW (ref 3.5–5.0)
Alkaline Phosphatase: 56 U/L (ref 38–126)
Anion gap: 6 (ref 5–15)
BUN: 12 mg/dL (ref 6–20)
CALCIUM: 8.2 mg/dL — AB (ref 8.9–10.3)
CHLORIDE: 110 mmol/L (ref 101–111)
CO2: 23 mmol/L (ref 22–32)
CREATININE: 0.71 mg/dL (ref 0.61–1.24)
GFR calc non Af Amer: >60 mL/min (ref >60)
GLUCOSE: 85 mg/dL (ref 65–99)
Potassium: 4.3 mmol/L (ref 3.5–5.1)
SODIUM: 139 mmol/L (ref 135–145)
Total Bilirubin: 0.4 mg/dL (ref 0.3–1.2)
Total Protein: 5.3 g/dL — ABNORMAL LOW (ref 6.5–8.1)

## 2016-02-04 LAB — CBC
HCT: 18.3 % — ABNORMAL LOW (ref 39.0–52.0)
Hemoglobin: 5.6 g/dL — CL (ref 13.0–17.0)
MCH: 28.7 pg (ref 26.0–34.0)
MCHC: 30.6 g/dL (ref 30.0–36.0)
MCV: 93.8 fL (ref 78.0–100.0)
PLATELETS: 318 10*3/uL (ref 150–400)
RBC: 1.95 MIL/uL — ABNORMAL LOW (ref 4.22–5.81)
RDW: 13.9 % (ref 11.5–15.5)
WBC: 4.9 10*3/uL (ref 4.0–10.5)

## 2016-02-04 LAB — POC OCCULT BLOOD, ED: Fecal Occult Bld: NEGATIVE

## 2016-02-04 LAB — PREPARE RBC (CROSSMATCH)

## 2016-02-04 MED ORDER — SODIUM CHLORIDE 0.9 % IV SOLN: INTRAVENOUS | Status: DC

## 2016-02-04 MED ORDER — SODIUM CHLORIDE 0.9 % IV SOLN
Freq: Once | INTRAVENOUS | Status: AC
  Administered 2016-02-04: 14:00:00 via INTRAVENOUS

## 2016-02-04 MED ORDER — FERROUS SULFATE 325 (65 FE) MG PO TABS
325.0000 mg | ORAL_TABLET | Freq: Every day | ORAL | Status: DC
  Administered 2016-02-05 – 2016-02-06 (×2): 325 mg via ORAL
  Filled 2016-02-04 (×2): qty 1

## 2016-02-04 MED ORDER — POLYETHYLENE GLYCOL 3350 17 G PO PACK
17.0000 g | PACK | Freq: Three times a day (TID) | ORAL | Status: DC
  Administered 2016-02-04 – 2016-02-06 (×6): 17 g via ORAL
  Filled 2016-02-04 (×6): qty 1

## 2016-02-04 MED ORDER — OXYBUTYNIN CHLORIDE ER 15 MG PO TB24
15.0000 mg | ORAL_TABLET | Freq: Two times a day (BID) | ORAL | Status: DC
  Administered 2016-02-04 – 2016-02-06 (×4): 15 mg via ORAL
  Filled 2016-02-04 (×5): qty 1

## 2016-02-04 MED ORDER — NORTRIPTYLINE HCL 25 MG PO CAPS
75.0000 mg | ORAL_CAPSULE | Freq: Every day | ORAL | Status: DC
  Administered 2016-02-04 – 2016-02-05 (×2): 75 mg via ORAL
  Filled 2016-02-04 (×2): qty 3

## 2016-02-04 MED ORDER — SULFAMETHOXAZOLE-TRIMETHOPRIM 800-160 MG PO TABS
1.0000 | ORAL_TABLET | Freq: Every day | ORAL | Status: DC
  Administered 2016-02-04 – 2016-02-06 (×3): 1 via ORAL
  Filled 2016-02-04 (×3): qty 1

## 2016-02-04 MED ORDER — FAMOTIDINE 20 MG PO TABS
40.0000 mg | ORAL_TABLET | Freq: Every day | ORAL | Status: DC
  Administered 2016-02-04 – 2016-02-06 (×3): 40 mg via ORAL
  Filled 2016-02-04 (×3): qty 2

## 2016-02-04 NOTE — ED Notes (Signed)
Family medicine at bedside.

## 2016-02-04 NOTE — Progress Notes (Signed)
RT took CPAP in room for patient. Settings high 15 low 5  patient home regimen as per patient. Patient stating he will place himself on and off when ready. RT informed patient if he has any trouble to have RN contact RT.

## 2016-02-04 NOTE — ED Notes (Signed)
Paged Dr. Randa EvensEdwards to [phone] David Odom([facility])
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
## 2016-02-04 NOTE — ED Provider Notes (Signed)
  Face-to-face evaluation   History: He saw his PCP today, for weakness, and malaise. Symptoms are subacute. He has daily rectal bleeding associated with "internal hemorrhoids". The blood he sees is bright red.  Physical exam: Alert, calm, cooperative. Abdomen soft and nontender. No respiratory distress.  Medical screening examination/treatment/procedure(s) were conducted as a shared visit with non-physician practitioner(s) and myself.  I personally evaluated the patient during the encounter    Mancel BaleElliott Tannis Burstein, MD 02/04/16 [phone]
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
## 2016-02-04 NOTE — ED Triage Notes (Signed)
Pt reports being sent here for low hgb of 5.8. Hx of same. Pt takes blood thinners, denies any GI bleeding. Reports fatigue and headache, appears pale at triage.

## 2016-02-05 ENCOUNTER — Encounter (HOSPITAL_COMMUNITY): Payer: Self-pay

## 2016-02-05 ENCOUNTER — Encounter (HOSPITAL_COMMUNITY)
Admission: EM | Disposition: A | Discharge status: Home / Self Care | Payer: Self-pay | Source: Home / Self Care | Attending: Family Medicine

## 2016-02-05 DIAGNOSIS — K625 Hemorrhage of anus and rectum: Secondary | ICD-10-CM

## 2016-02-05 HISTORY — PX: FLEXIBLE SIGMOIDOSCOPY: SHX5431

## 2016-02-05 LAB — TYPE AND SCREEN
ABO/RH(D): B POS
ANTIBODY SCREEN: NEGATIVE
UNIT DIVISION: 0
UNIT DIVISION: 0

## 2016-02-05 LAB — BASIC METABOLIC PANEL
ANION GAP: 7 (ref 5–15)
BUN: 8 mg/dL (ref 6–20)
CO2: 25 mmol/L (ref 22–32)
Calcium: 8.1 mg/dL — ABNORMAL LOW (ref 8.9–10.3)
Chloride: 107 mmol/L (ref 101–111)
Creatinine, Ser: 0.62 mg/dL (ref 0.61–1.24)
GFR calc Af Amer: >60 mL/min (ref >60)
Glucose, Bld: 67 mg/dL (ref 65–99)
POTASSIUM: 3.7 mmol/L (ref 3.5–5.1)
SODIUM: 139 mmol/L (ref 135–145)

## 2016-02-05 LAB — CBC
HCT: 23.2 % — ABNORMAL LOW (ref 39.0–52.0)
HEMOGLOBIN: 7.3 g/dL — AB (ref 13.0–17.0)
MCH: 29.2 pg (ref 26.0–34.0)
MCHC: 31.5 g/dL (ref 30.0–36.0)
MCV: 92.8 fL (ref 78.0–100.0)
PLATELETS: 259 10*3/uL (ref 150–400)
RBC: 2.5 MIL/uL — AB (ref 4.22–5.81)
RDW: 14.2 % (ref 11.5–15.5)
WBC: 5.3 10*3/uL (ref 4.0–10.5)

## 2016-02-05 LAB — HEMOGLOBIN AND HEMATOCRIT, BLOOD
HEMATOCRIT: 23.8 % — AB (ref 39.0–52.0)
HEMOGLOBIN: 7.5 g/dL — AB (ref 13.0–17.0)

## 2016-02-05 SURGERY — SIGMOIDOSCOPY, FLEXIBLE
Anesthesia: Moderate Sedation

## 2016-02-05 MED ORDER — HYDROCORTISONE ACETATE 25 MG RE SUPP
25.0000 mg | Freq: Two times a day (BID) | RECTAL | Status: DC
  Administered 2016-02-05 – 2016-02-06 (×2): 25 mg via RECTAL
  Filled 2016-02-05 (×3): qty 1

## 2016-02-05 MED ORDER — MIDAZOLAM HCL 5 MG/ML IJ SOLN
INTRAMUSCULAR | Status: AC
  Filled 2016-02-05: qty 2

## 2016-02-05 MED ORDER — FENTANYL CITRATE (PF) 100 MCG/2ML IJ SOLN
INTRAMUSCULAR | Status: AC
  Filled 2016-02-05: qty 2

## 2016-02-05 MED ORDER — SODIUM CHLORIDE 0.9 % IV SOLN
INTRAVENOUS | Status: DC
  Administered 2016-02-05 (×2): via INTRAVENOUS

## 2016-02-05 NOTE — Progress Notes (Signed)
RT filled water chamber up on CPAP with sterile water. Patient places self on and off CPAP when ready and knows to call if he has any trouble.
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
## 2016-02-05 NOTE — Care Management Note (Signed)
Case Management Note  Patient Details  Name: Marvetta Gibbonslan D Nine MRN: [id number] Date of Birth: [birth year]  Subjective/Objective:                 Spoke with patient and significant other at the bedside. Patient is paraplegic admitted for anemia 2/2 internal hemorrhoids and xaralto use from recent DVT. Patient [facility], no O2, has WC and all DME in working order, does not need additional DME at this time. He states that he does not need or use HH at this time. He drives, and denies difficulties obtaining meds. PCP is Dr Eula ListenHussain, and pharmacy [address] [facility] [address]   Action/Plan:  CM will continue to follow for DC planning needs.  Expected Discharge Date:                  Expected Discharge Plan:  Home/Self Care  In-House Referral:  NA  Discharge planning Services  CM Consult  Post Acute Care Choice:  NA Choice offered to:  NA  DME Arranged:  N/A DME Agency:  NA  HH Arranged:  NA HH Agency:  NA  Status of Service:  In process, will continue to follow  If discussed at Long Length of Stay Meetings, dates discussed:    Additional Comments:  Lawerance SabalDebbie Kashina Mecum, RN 02/05/2016, 4:04 PM

## 2016-02-05 NOTE — Op Note (Signed)
[facility] Patient Name: David Odom Procedure Date : 02/05/2016 MRN: [id number] Attending MD: Tresea Mall Dr., MD Date of Birth: Oct 15, 1960 CSN: [id number] Age: 55 Admit Type: Inpatient Procedure:                Flexible Sigmoidoscopy Indications:              Rectal hemorrhagewith every bowel movement in                            gentleman who is paraplegic. He has a DVT has been                            on Xarelto. Providers:                Llana Aliment. Toney Lizaola Dr., MD, Tomma Rakers, RN,                            Arlee Muslim Tech., Technician Referring MD:              Medicines:                None Complications:            No immediate complications. Estimated Blood Loss:     Estimated blood loss: none. Procedure:                Pre-Anesthesia Assessment:                           - Prior to the procedure, a History and Physical                            was performed, and patient medications and                            allergies were reviewed. The patient's tolerance of                            previous anesthesia was also reviewed. The risks                            and benefits of the procedure and the sedation                            options and risks were discussed with the patient.                            All questions were answered, and informed consent                            was obtained. Prior Anticoagulants: The patient has                            taken Xarelto (rivaroxaban), last dose was 2 days  prior to procedure. ASA Grade Assessment: III - A                            patient with severe systemic disease. After                            reviewing the risks and benefits, the patient was                            deemed in satisfactory condition to undergo the                            procedure.                           After obtaining informed consent, the scope was         passed under direct vision. The EC-[id number] ([id number]                            scope was introduced through the anus and advanced                            to the the descending colon. The flexible                            sigmoidoscopy was accomplished without difficulty.                            The patient tolerated the procedure well. The                            quality of the bowel preparation was fair. the                            patient had been prepped with tap water enemas. Findings:      The perianal exam findings include non-thrombosed external hemorrhoids       and internal hemorrhoids (Grade I).      Non-bleeding internal hemorrhoids were found during retroflexion. The       hemorrhoids were medium-sized and Grade I (internal hemorrhoids that do       not prolapse).      There is no endoscopic evidence of bleeding, diverticula, mass, polyps,       ulcerations or tumor in the recto-sigmoid colon, in the sigmoid colon       and in the descending colon. advanced up to about 80 cm Impression:               - Preparation of the colon was fair.                           - Non-thrombosed external hemorrhoids and internal                            hemorrhoids (Grade I) found on perianal exam.                           -  Non-bleeding internal hemorrhoids.                           - No specimens collected.                           - Hemorrhoids. this was probably the source of his                            rectal bleeding exacerbated by the anticoagulation Recommendation:           - Return patient to hospital ward for ongoing care.                           - Use hydrocortisone suppository 25 mg 2 per rectum                            once a day.                           - may need to be seen by anal rectal surgeon Procedure Code(s):        --- Professional ---                           [phone], Sigmoidoscopy, flexible; diagnostic,                             including collection of specimen(s) by brushing or                            washing, when performed (separate procedure) Diagnosis Code(s):        --- Professional ---                           K62.5, Hemorrhage of anus and rectum                           K64.4, Residual hemorrhoidal skin tags                           K64.0, First degree hemorrhoids CPT copyright 2016 American Medical Association. All rights reserved. The codes documented in this report are preliminary and upon coder review may  be revised to meet current compliance requirements. Tresea MallJames L Levy Wellman Dr., MD 02/05/2016 3:25:56 PM This report has been signed electronically. Number of Addenda: 0
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
## 2016-02-06 LAB — CBC
HCT: 24.8 % — ABNORMAL LOW (ref 39.0–52.0)
Hemoglobin: 7.7 g/dL — ABNORMAL LOW (ref 13.0–17.0)
MCH: 28.8 pg (ref 26.0–34.0)
MCHC: 31 g/dL (ref 30.0–36.0)
MCV: 92.9 fL (ref 78.0–100.0)
Platelets: 280 10*3/uL (ref 150–400)
RBC: 2.67 MIL/uL — ABNORMAL LOW (ref 4.22–5.81)
RDW: 14.1 % (ref 11.5–15.5)
WBC: 4.6 10*3/uL (ref 4.0–10.5)

## 2016-02-06 MED ORDER — POLYETHYLENE GLYCOL 3350 17 G PO PACK: 17.0000 g | PACK | Freq: Every day | ORAL | 0 refills | Status: DC | PRN

## 2016-02-06 MED ORDER — HYDROCORTISONE ACETATE 25 MG RE SUPP: 25.0000 mg | Freq: Two times a day (BID) | RECTAL | 0 refills | Status: DC

## 2016-02-06 NOTE — Discharge Instructions (Signed)
You were in the hospital for symptomatic anemia. This was likely due to your internal hemorrhoids. You were given a blood transfusion. GI physicians were consulted and did a sigmoidoscopy which noted the internal hemorrhoids but did not find any active bleeding. They started you on rectal steroids (steroid suppositories). Please continue this until you are seen by your GI doctor, Dr. Laural BenesJohnson. Because of your high risk of bleeding with the internal hemorrhoids, we stopped your Xarelto; your bleed risk is too high to continue this medication. We also sent a prescription for miralax that you can take daily as needed if you have constipation.   Please make a hospital follow up appointment with your primary care provider as well.   If you start to have bright red blood per rectum again, please seek immediate medical attention.

## 2016-02-06 NOTE — Progress Notes (Signed)
Discharge instructions (including medications) discussed with and copy provided to patient/caregiver. Pt understands teachings via teachback. All questions answered. Belongings with patient.
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
## 2016-02-06 NOTE — Progress Notes (Signed)
[facility] PROGRESS NOTE Subjective No further bleeding decision has been made to avoid anticoagulation.  Objective: Vital signs in last 24 hours: Temp:  [97.8 F (36.6 C)-98.6 F (37 C)] 98.4 F (36.9 C) (08/23 0623) Pulse Rate:  [87-100] 87 (08/23 0623) Resp:  [0-20] 19 (08/22 2145) BP: (106-129)/(51-71) 114/60 (08/23 0623) SpO2:  [98 %-100 %] 100 % (08/23 0623) Weight:  [83.5 kg (184 lb)] 83.5 kg (184 lb) (08/22 1431) Last BM Date: 02/05/16  Intake/Output from previous day: 08/22 0701 - 08/23 0700 In: -  Out: 3625 [Urine:3625] Intake/Output this shift: No intake/output data recorded.    Lab Results:  Recent Labs  02/04/16 1129 02/05/16 0045 02/05/16 0424 02/06/16 0519  WBC 4.9  --  5.3 4.6  HGB 5.6* 7.5* 7.3* 7.7*  HCT 18.3* 23.8* 23.2* 24.8*  PLT 318  --  259 280   BMET  Recent Labs  02/04/16 1129 02/05/16 0424  NA 139 139  K 4.3 3.7  CL 110 107  CO2 23 25  CREATININE 0.71 0.62   LFT  Recent Labs  02/04/16 1129  PROT 5.3*  AST 20  ALT 18  ALKPHOS 56  BILITOT 0.4   PT/INR No results for input(s): LABPROT, INR in the last 72 hours. PANCREAS No results for input(s): LIPASE in the last 72 hours.       Studies/Results: No results found.  Medications: I have reviewed the patient's current medications.  Assessment/Plan: 1.Rectal bleeding. Appears to have stopped after anticoagulation stopped. Patient is being treated with hydrocortisone suppositories. I would continue this and have him follow up with Dr. Laural BenesJohnson is an outpatient.   Bryanda Mikel JR,Verneal Wiers L 02/06/2016, 9:43 AM  This note was created using voice recognition software. Minor errors may Have occurred unintentionally.  Pager: [phone] If no answer or after hours call [phone]

## 2016-02-14 ENCOUNTER — Ambulatory Visit
Admission: RE | Admit: 2016-02-14 | Discharge: 2016-02-14 | Disposition: A | Discharge status: Home / Self Care | Payer: Medicare Other | Source: Ambulatory Visit | Attending: Internal Medicine | Admitting: Internal Medicine

## 2016-02-14 ENCOUNTER — Other Ambulatory Visit: Payer: Self-pay | Admitting: Internal Medicine

## 2016-02-14 DIAGNOSIS — I82401 Acute embolism and thrombosis of unspecified deep veins of right lower extremity: Secondary | ICD-10-CM

## 2016-02-14 DIAGNOSIS — K922 Gastrointestinal hemorrhage, unspecified: Secondary | ICD-10-CM | POA: Diagnosis not present

## 2016-02-14 DIAGNOSIS — D649 Anemia, unspecified: Secondary | ICD-10-CM | POA: Diagnosis not present

## 2016-02-15 ENCOUNTER — Other Ambulatory Visit: Payer: Self-pay | Admitting: Gastroenterology

## 2016-03-20 DIAGNOSIS — D500 Iron deficiency anemia secondary to blood loss (chronic): Secondary | ICD-10-CM | POA: Diagnosis not present

## 2016-03-25 ENCOUNTER — Encounter (HOSPITAL_COMMUNITY): Payer: Self-pay | Admitting: *Deleted

## 2016-03-30 NOTE — Anesthesia Preprocedure Evaluation (Addendum)
Anesthesia Evaluation  Patient identified by MRN, date of birth, ID band Patient awake    Reviewed: Allergy & Precautions, NPO status , Patient's Chart, lab work & pertinent test results  Airway Mallampati: I  TM Distance: >3 FB Neck ROM: Full    Dental  (+) Teeth Intact   Pulmonary neg pulmonary ROS, sleep apnea ,    breath sounds clear to auscultation       Cardiovascular negative cardio ROS   Rhythm:Regular Rate:Normal     Neuro/Psych Paraplegia p spinal cord injury    GI/Hepatic For colonoscopy   Endo/Other  diabetes  Renal/GU      Musculoskeletal   Abdominal   Peds  Hematology  (+) anemia ,   Anesthesia Other Findings   Reproductive/Obstetrics                            Anesthesia Physical Anesthesia Plan  ASA: II  Anesthesia Plan: MAC   Post-op Pain Management:    Induction: Intravenous  Airway Management Planned: Natural Airway and Simple Face Mask  Additional Equipment:   Intra-op Plan:   Post-operative Plan:   Informed Consent: I have reviewed the patients History and Physical, chart, labs and discussed the procedure including the risks, benefits and alternatives for the proposed anesthesia with the patient or authorized representative who has indicated his/her understanding and acceptance.     Plan Discussed with: CRNA  Anesthesia Plan Comments:        Anesthesia Quick Evaluation

## 2016-03-31 ENCOUNTER — Encounter (HOSPITAL_COMMUNITY)
Admission: RE | Disposition: A | Discharge status: Home / Self Care | Payer: Self-pay | Source: Ambulatory Visit | Attending: Gastroenterology

## 2016-03-31 ENCOUNTER — Encounter (HOSPITAL_COMMUNITY): Payer: Self-pay | Admitting: *Deleted

## 2016-03-31 ENCOUNTER — Ambulatory Visit (HOSPITAL_COMMUNITY)
Admission: RE | Admit: 2016-03-31 | Discharge: 2016-03-31 | Disposition: A | Discharge status: Home / Self Care | Payer: Medicare Other | Source: Ambulatory Visit | Attending: Gastroenterology | Admitting: Gastroenterology

## 2016-03-31 ENCOUNTER — Ambulatory Visit (HOSPITAL_COMMUNITY): Payer: Medicare Other | Admitting: Certified Registered Nurse Anesthetist

## 2016-03-31 DIAGNOSIS — K648 Other hemorrhoids: Secondary | ICD-10-CM | POA: Diagnosis not present

## 2016-03-31 DIAGNOSIS — Z79899 Other long term (current) drug therapy: Secondary | ICD-10-CM | POA: Insufficient documentation

## 2016-03-31 DIAGNOSIS — Z86010 Personal history of colonic polyps: Secondary | ICD-10-CM | POA: Diagnosis not present

## 2016-03-31 DIAGNOSIS — G8220 Paraplegia, unspecified: Secondary | ICD-10-CM | POA: Diagnosis not present

## 2016-03-31 DIAGNOSIS — N319 Neuromuscular dysfunction of bladder, unspecified: Secondary | ICD-10-CM | POA: Diagnosis not present

## 2016-03-31 DIAGNOSIS — Z23 Encounter for immunization: Secondary | ICD-10-CM | POA: Diagnosis not present

## 2016-03-31 DIAGNOSIS — G4733 Obstructive sleep apnea (adult) (pediatric): Secondary | ICD-10-CM | POA: Diagnosis not present

## 2016-03-31 DIAGNOSIS — M810 Age-related osteoporosis without current pathological fracture: Secondary | ICD-10-CM | POA: Diagnosis not present

## 2016-03-31 DIAGNOSIS — K562 Volvulus: Secondary | ICD-10-CM | POA: Diagnosis not present

## 2016-03-31 DIAGNOSIS — Z1389 Encounter for screening for other disorder: Secondary | ICD-10-CM | POA: Diagnosis not present

## 2016-03-31 DIAGNOSIS — Z7901 Long term (current) use of anticoagulants: Secondary | ICD-10-CM | POA: Insufficient documentation

## 2016-03-31 DIAGNOSIS — K921 Melena: Secondary | ICD-10-CM | POA: Insufficient documentation

## 2016-03-31 DIAGNOSIS — D649 Anemia, unspecified: Secondary | ICD-10-CM | POA: Diagnosis not present

## 2016-03-31 DIAGNOSIS — E119 Type 2 diabetes mellitus without complications: Secondary | ICD-10-CM | POA: Insufficient documentation

## 2016-03-31 DIAGNOSIS — I872 Venous insufficiency (chronic) (peripheral): Secondary | ICD-10-CM | POA: Diagnosis not present

## 2016-03-31 DIAGNOSIS — D509 Iron deficiency anemia, unspecified: Secondary | ICD-10-CM | POA: Diagnosis not present

## 2016-03-31 DIAGNOSIS — Z1159 Encounter for screening for other viral diseases: Secondary | ICD-10-CM | POA: Diagnosis not present

## 2016-03-31 DIAGNOSIS — Z1211 Encounter for screening for malignant neoplasm of colon: Secondary | ICD-10-CM | POA: Diagnosis not present

## 2016-03-31 DIAGNOSIS — G5600 Carpal tunnel syndrome, unspecified upper limb: Secondary | ICD-10-CM | POA: Insufficient documentation

## 2016-03-31 DIAGNOSIS — Z125 Encounter for screening for malignant neoplasm of prostate: Secondary | ICD-10-CM | POA: Diagnosis not present

## 2016-03-31 DIAGNOSIS — Z0000 Encounter for general adult medical examination without abnormal findings: Secondary | ICD-10-CM | POA: Diagnosis not present

## 2016-03-31 HISTORY — PX: COLONOSCOPY WITH PROPOFOL: SHX5780

## 2016-03-31 LAB — GLUCOSE, CAPILLARY: Glucose-Capillary: 74 mg/dL (ref 65–99)

## 2016-03-31 SURGERY — COLONOSCOPY WITH PROPOFOL
Anesthesia: Monitor Anesthesia Care

## 2016-03-31 MED ORDER — ONDANSETRON HCL 4 MG/2ML IJ SOLN
INTRAMUSCULAR | Status: DC | PRN
  Administered 2016-03-31: 4 mg via INTRAVENOUS

## 2016-03-31 MED ORDER — LACTATED RINGERS IV SOLN
INTRAVENOUS | Status: DC
  Administered 2016-03-31: 1000 mL via INTRAVENOUS

## 2016-03-31 MED ORDER — SODIUM CHLORIDE 0.9 % IV SOLN: INTRAVENOUS | Status: DC

## 2016-03-31 MED ORDER — PROPOFOL 10 MG/ML IV BOLUS
INTRAVENOUS | Status: AC
  Filled 2016-03-31: qty 20

## 2016-03-31 MED ORDER — LIDOCAINE 2% (20 MG/ML) 5 ML SYRINGE
INTRAMUSCULAR | Status: AC
  Filled 2016-03-31: qty 5

## 2016-03-31 MED ORDER — PROPOFOL 500 MG/50ML IV EMUL
INTRAVENOUS | Status: DC | PRN
  Administered 2016-03-31: 100 ug/kg/min via INTRAVENOUS

## 2016-03-31 MED ORDER — PROPOFOL 10 MG/ML IV BOLUS
INTRAVENOUS | Status: DC | PRN
  Administered 2016-03-31: 20 mg via INTRAVENOUS

## 2016-03-31 MED ORDER — ONDANSETRON HCL 4 MG/2ML IJ SOLN
INTRAMUSCULAR | Status: AC
  Filled 2016-03-31: qty 2

## 2016-03-31 MED ORDER — PROPOFOL 10 MG/ML IV BOLUS
INTRAVENOUS | Status: AC
  Filled 2016-03-31: qty 40

## 2016-03-31 MED ORDER — LIDOCAINE 2% (20 MG/ML) 5 ML SYRINGE
INTRAMUSCULAR | Status: DC | PRN
  Administered 2016-03-31: 50 mg via INTRAVENOUS

## 2016-03-31 SURGICAL SUPPLY — 22 items

## 2016-03-31 NOTE — Anesthesia Procedure Notes (Signed)
Procedure Name: MAC Date/Time: 03/31/2016 8:32 AM Performed by: Wynonia SoursWALKER, Eduardo Honor L Pre-anesthesia Checklist: Patient identified, Timeout performed, Emergency Drugs available, Suction available and Patient being monitored Patient Re-evaluated:Patient Re-evaluated prior to inductionOxygen Delivery Method: Nasal cannula Placement Confirmation: positive ETCO2 Dental Injury: Teeth and Oropharynx as per pre-operative assessment

## 2016-03-31 NOTE — Op Note (Signed)
[facility] Patient Name: David Odom Procedure Date: 03/31/2016 MRN: [id number] Attending MD: Charolett BumpersMartin K Sultan Pargas , MD Date of Birth: 1960-08-13 CSN: [id number] Age: 55 Admit Type: Outpatient Procedure:                Colonoscopy Indications:              High risk colon cancer surveillance: Personal                            history of adenoma less than 10 mm in size.                            Painless hematochezia. Providers:                Charolett BumpersMartin K. Rakim Moone, MD, Omelia BlackwaterShelby Carpenter RN, RN, Lorenda IshiharaSam                            Tetteh, Technician, Kym GroomKaren Walker, CRNA Referring MD:              Medicines:                Propofol per Anesthesia Complications:            No immediate complications. Estimated Blood Loss:     Estimated blood loss: none. Procedure:                Pre-Anesthesia Assessment:                           - Prior to the procedure, a History and Physical                            was performed, and patient medications and                            allergies were reviewed. The patient's tolerance of                            previous anesthesia was also reviewed. The risks                            and benefits of the procedure and the sedation                            options and risks were discussed with the patient.                            All questions were answered, and informed consent                            was obtained. Prior Anticoagulants: The patient has                            taken no previous anticoagulant or antiplatelet  agents. ASA Grade Assessment: III - A patient with                            severe systemic disease. After reviewing the risks                            and benefits, the patient was deemed in                            satisfactory condition to undergo the procedure.                           After obtaining informed consent, the colonoscope                            was  passed under direct vision. Throughout the                            procedure, the patient's blood pressure, pulse, and                            oxygen saturations were monitored continuously. The                            EC-3490LI ([id number]) scope was introduced through                            the anus and advanced to the the cecum, identified                            by appendiceal orifice and ileocecal valve. The                            colonoscopy was technically difficult and complex                            due to significant looping. The patient tolerated                            the procedure well. The quality of the bowel                            preparation was good after vigorous water lavage of                            the colon.. The appendiceal orifice and the rectum                            were photographed. Scope In: 8:41:06 AM Scope Out: 9:37:27 AM Scope Withdrawal Time: 0 hours 20 minutes 40 seconds  Total Procedure Duration: 0 hours 56 minutes 21 seconds  Findings:      The perianal and digital rectal examinations were normal.      The entire examined colon appeared  normal. Internal hemorrhoids were       present. Impression:               - The entire examined colon is normal.                           - No specimens collected. Moderate Sedation:      N/A- Per Anesthesia Care Recommendation:           - Patient has a contact number available for                            emergencies. The signs and symptoms of potential                            delayed complications were discussed with the                            patient. Return to normal activities tomorrow.                            Written discharge instructions were provided to the                            patient.                           - Repeat colonoscopy in 10 years for surveillance.                           - Resume previous diet.                           - Continue  present medications. Procedure Code(s):        --- Professional ---                           Z6109, Colorectal cancer screening; colonoscopy on                            individual at high risk Diagnosis Code(s):        --- Professional ---                           Z86.010, Personal history of colonic polyps CPT copyright 2016 American Medical Association. All rights reserved. The codes documented in this report are preliminary and upon coder review may  be revised to meet current compliance requirements. Danise Edge, MD Charolett Bumpers, MD 03/31/2016 9:46:46 AM This report has been signed electronically. Number of Addenda: 0

## 2016-03-31 NOTE — Discharge Instructions (Signed)

## 2016-03-31 NOTE — Anesthesia Postprocedure Evaluation (Signed)
Anesthesia Post Note  Patient: David Odom  Procedure(s) Performed: Procedure(s) (LRB): COLONOSCOPY WITH PROPOFOL (N/A)  Patient location during evaluation: Endoscopy Anesthesia Type: MAC Level of consciousness: awake and alert Pain management: pain level controlled Vital Signs Assessment: post-procedure vital signs reviewed and stable Respiratory status: spontaneous breathing, nonlabored ventilation, respiratory function stable and patient connected to nasal cannula oxygen Cardiovascular status: stable and blood pressure returned to baseline Anesthetic complications: no    Last Vitals:  Vitals:   03/31/16 0729 03/31/16 0946  BP: (!) 152/79 111/73  Pulse: 91 75  Resp: 16 14  Temp: 36.8 C 36.4 C    Last Pain:  Vitals:   03/31/16 0946  TempSrc: Oral                 Sueo Cullen,JAMES TERRILL
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
## 2016-03-31 NOTE — Transfer of Care (Signed)
Immediate Anesthesia Transfer of Care Note  Patient: David Odom  Procedure(s) Performed: Procedure(s): COLONOSCOPY WITH PROPOFOL (N/A)  Patient Location: PACU  Anesthesia Type:MAC  Level of Consciousness:  sedated, patient cooperative and responds to stimulation  Airway & Oxygen Therapy:Patient Spontanous Breathing and Patient connected to face mask oxgen  Post-op Assessment:  Report given to PACU RN and Post -op Vital signs reviewed and stable  Post vital signs:  Reviewed and stable  Last Vitals:  Vitals:   03/31/16 0729  BP: (!) 152/79  Pulse: 91  Resp: 16  Temp: 36.8 C    Complications: No apparent anesthesia complications

## 2016-04-01 ENCOUNTER — Encounter (HOSPITAL_COMMUNITY): Payer: Self-pay | Admitting: Gastroenterology

## 2016-08-06 DIAGNOSIS — R32 Unspecified urinary incontinence: Secondary | ICD-10-CM | POA: Diagnosis not present

## 2016-08-06 DIAGNOSIS — N1330 Unspecified hydronephrosis: Secondary | ICD-10-CM | POA: Diagnosis not present

## 2016-08-06 DIAGNOSIS — N319 Neuromuscular dysfunction of bladder, unspecified: Secondary | ICD-10-CM | POA: Diagnosis not present

## 2016-09-29 DIAGNOSIS — G4733 Obstructive sleep apnea (adult) (pediatric): Secondary | ICD-10-CM | POA: Diagnosis not present

## 2016-09-30 DIAGNOSIS — G8220 Paraplegia, unspecified: Secondary | ICD-10-CM | POA: Diagnosis not present

## 2016-09-30 DIAGNOSIS — D509 Iron deficiency anemia, unspecified: Secondary | ICD-10-CM | POA: Diagnosis not present

## 2016-09-30 DIAGNOSIS — G4733 Obstructive sleep apnea (adult) (pediatric): Secondary | ICD-10-CM | POA: Diagnosis not present

## 2016-09-30 DIAGNOSIS — N319 Neuromuscular dysfunction of bladder, unspecified: Secondary | ICD-10-CM | POA: Diagnosis not present

## 2016-09-30 DIAGNOSIS — E119 Type 2 diabetes mellitus without complications: Secondary | ICD-10-CM | POA: Diagnosis not present

## 2017-04-06 DIAGNOSIS — G8220 Paraplegia, unspecified: Secondary | ICD-10-CM | POA: Diagnosis not present

## 2017-04-06 DIAGNOSIS — K649 Unspecified hemorrhoids: Secondary | ICD-10-CM | POA: Diagnosis not present

## 2017-04-06 DIAGNOSIS — G4733 Obstructive sleep apnea (adult) (pediatric): Secondary | ICD-10-CM | POA: Diagnosis not present

## 2017-04-06 DIAGNOSIS — M1310 Monoarthritis, not elsewhere classified, unspecified site: Secondary | ICD-10-CM | POA: Diagnosis not present

## 2017-04-06 DIAGNOSIS — Z23 Encounter for immunization: Secondary | ICD-10-CM | POA: Diagnosis not present

## 2017-04-06 DIAGNOSIS — E1140 Type 2 diabetes mellitus with diabetic neuropathy, unspecified: Secondary | ICD-10-CM | POA: Diagnosis not present

## 2017-04-06 DIAGNOSIS — Z1389 Encounter for screening for other disorder: Secondary | ICD-10-CM | POA: Diagnosis not present

## 2017-04-06 DIAGNOSIS — N319 Neuromuscular dysfunction of bladder, unspecified: Secondary | ICD-10-CM | POA: Diagnosis not present

## 2017-04-06 DIAGNOSIS — Z125 Encounter for screening for malignant neoplasm of prostate: Secondary | ICD-10-CM | POA: Diagnosis not present

## 2017-04-06 DIAGNOSIS — Z0000 Encounter for general adult medical examination without abnormal findings: Secondary | ICD-10-CM | POA: Diagnosis not present

## 2017-04-06 DIAGNOSIS — D500 Iron deficiency anemia secondary to blood loss (chronic): Secondary | ICD-10-CM | POA: Diagnosis not present

## 2017-04-22 ENCOUNTER — Ambulatory Visit: Payer: Self-pay | Admitting: General Surgery

## 2017-04-22 DIAGNOSIS — K644 Residual hemorrhoidal skin tags: Secondary | ICD-10-CM | POA: Diagnosis not present

## 2017-04-22 DIAGNOSIS — K648 Other hemorrhoids: Secondary | ICD-10-CM | POA: Diagnosis not present

## 2017-04-24 ENCOUNTER — Encounter (HOSPITAL_COMMUNITY): Payer: Self-pay | Admitting: *Deleted

## 2017-04-24 NOTE — Progress Notes (Signed)
Spoke with pt for pre-op call. Pt denies cardiac history, chest pain or sob. Pt is a type 2 Diabetic, last A1C was 4.8 on 03/25/17. States his fasting blood sugar usually runs in the 90's. Pt is a paraplegic.

## 2017-04-27 ENCOUNTER — Ambulatory Visit (HOSPITAL_COMMUNITY)
Admission: RE | Admit: 2017-04-27 | Discharge: 2017-04-27 | Disposition: A | Discharge status: Home / Self Care | Payer: Medicare Other | Source: Ambulatory Visit | Attending: General Surgery | Admitting: General Surgery

## 2017-04-27 ENCOUNTER — Ambulatory Visit (HOSPITAL_COMMUNITY): Payer: Medicare Other | Admitting: Anesthesiology

## 2017-04-27 ENCOUNTER — Other Ambulatory Visit: Payer: Self-pay

## 2017-04-27 ENCOUNTER — Encounter (HOSPITAL_COMMUNITY): Payer: Self-pay | Admitting: *Deleted

## 2017-04-27 ENCOUNTER — Encounter (HOSPITAL_COMMUNITY)
Admission: RE | Disposition: A | Discharge status: Home / Self Care | Payer: Self-pay | Source: Ambulatory Visit | Attending: General Surgery

## 2017-04-27 DIAGNOSIS — K644 Residual hemorrhoidal skin tags: Secondary | ICD-10-CM | POA: Insufficient documentation

## 2017-04-27 DIAGNOSIS — L918 Other hypertrophic disorders of the skin: Secondary | ICD-10-CM | POA: Diagnosis not present

## 2017-04-27 DIAGNOSIS — K621 Rectal polyp: Secondary | ICD-10-CM | POA: Diagnosis not present

## 2017-04-27 DIAGNOSIS — Z9989 Dependence on other enabling machines and devices: Secondary | ICD-10-CM | POA: Diagnosis not present

## 2017-04-27 DIAGNOSIS — D649 Anemia, unspecified: Secondary | ICD-10-CM | POA: Diagnosis not present

## 2017-04-27 DIAGNOSIS — K648 Other hemorrhoids: Secondary | ICD-10-CM | POA: Diagnosis not present

## 2017-04-27 DIAGNOSIS — E119 Type 2 diabetes mellitus without complications: Secondary | ICD-10-CM | POA: Diagnosis not present

## 2017-04-27 DIAGNOSIS — Z8371 Family history of colonic polyps: Secondary | ICD-10-CM | POA: Insufficient documentation

## 2017-04-27 DIAGNOSIS — G4730 Sleep apnea, unspecified: Secondary | ICD-10-CM | POA: Diagnosis not present

## 2017-04-27 DIAGNOSIS — Z79899 Other long term (current) drug therapy: Secondary | ICD-10-CM | POA: Diagnosis not present

## 2017-04-27 DIAGNOSIS — G8220 Paraplegia, unspecified: Secondary | ICD-10-CM | POA: Diagnosis not present

## 2017-04-27 DIAGNOSIS — K649 Unspecified hemorrhoids: Secondary | ICD-10-CM | POA: Diagnosis not present

## 2017-04-27 HISTORY — PX: HEMORRHOID SURGERY: SHX153

## 2017-04-27 HISTORY — DX: Peripheral vascular disease, unspecified: I73.9

## 2017-04-27 HISTORY — PX: EXCISION OF SKIN TAG: SHX6270

## 2017-04-27 LAB — HEMOGLOBIN A1C
Hgb A1c MFr Bld: 5 % (ref 4.8–5.6)
Mean Plasma Glucose: 96.8 mg/dL

## 2017-04-27 LAB — CBC
HEMATOCRIT: 46.5 % (ref 39.0–52.0)
Hemoglobin: 16.1 g/dL (ref 13.0–17.0)
MCH: 32.3 pg (ref 26.0–34.0)
MCHC: 34.6 g/dL (ref 30.0–36.0)
MCV: 93.4 fL (ref 78.0–100.0)
PLATELETS: 211 10*3/uL (ref 150–400)
RBC: 4.98 MIL/uL (ref 4.22–5.81)
RDW: 13.3 % (ref 11.5–15.5)
WBC: 5.9 10*3/uL (ref 4.0–10.5)

## 2017-04-27 LAB — BASIC METABOLIC PANEL
ANION GAP: 9 (ref 5–15)
BUN: 12 mg/dL (ref 6–20)
CHLORIDE: 105 mmol/L (ref 101–111)
CO2: 23 mmol/L (ref 22–32)
Calcium: 8.7 mg/dL — ABNORMAL LOW (ref 8.9–10.3)
Creatinine, Ser: 0.67 mg/dL (ref 0.61–1.24)
GFR calc Af Amer: >60 mL/min (ref >60)
GLUCOSE: 86 mg/dL (ref 65–99)
POTASSIUM: 3.7 mmol/L (ref 3.5–5.1)
Sodium: 137 mmol/L (ref 135–145)

## 2017-04-27 LAB — GLUCOSE, CAPILLARY: GLUCOSE-CAPILLARY: 86 mg/dL (ref 65–99)

## 2017-04-27 SURGERY — HEMORRHOIDECTOMY
Anesthesia: General | Site: Rectum

## 2017-04-27 MED ORDER — 0.9 % SODIUM CHLORIDE (POUR BTL) OPTIME
TOPICAL | Status: DC | PRN
  Administered 2017-04-27: 1000 mL

## 2017-04-27 MED ORDER — LACTATED RINGERS IV SOLN
INTRAVENOUS | Status: DC | PRN
  Administered 2017-04-27: 07:00:00 via INTRAVENOUS

## 2017-04-27 MED ORDER — BUPIVACAINE-EPINEPHRINE (PF) 0.5% -1:200000 IJ SOLN
INTRAMUSCULAR | Status: AC
  Filled 2017-04-27: qty 30

## 2017-04-27 MED ORDER — MIDAZOLAM HCL 2 MG/2ML IJ SOLN
INTRAMUSCULAR | Status: AC
  Filled 2017-04-27: qty 2

## 2017-04-27 MED ORDER — MIDAZOLAM HCL 5 MG/5ML IJ SOLN
INTRAMUSCULAR | Status: DC | PRN
  Administered 2017-04-27: 2 mg via INTRAVENOUS

## 2017-04-27 MED ORDER — SUGAMMADEX SODIUM 200 MG/2ML IV SOLN
INTRAVENOUS | Status: AC
  Filled 2017-04-27: qty 2

## 2017-04-27 MED ORDER — ONDANSETRON HCL 4 MG/2ML IJ SOLN
INTRAMUSCULAR | Status: DC | PRN
  Administered 2017-04-27: 4 mg via INTRAVENOUS

## 2017-04-27 MED ORDER — LIDOCAINE 2% (20 MG/ML) 5 ML SYRINGE
INTRAMUSCULAR | Status: AC
  Filled 2017-04-27: qty 5

## 2017-04-27 MED ORDER — ROCURONIUM BROMIDE 10 MG/ML (PF) SYRINGE
PREFILLED_SYRINGE | INTRAVENOUS | Status: DC | PRN
  Administered 2017-04-27: 40 mg via INTRAVENOUS

## 2017-04-27 MED ORDER — THROMBIN 20000 UNITS EX KIT
PACK | CUTANEOUS | Status: DC | PRN
  Administered 2017-04-27: 20000 [IU] via TOPICAL

## 2017-04-27 MED ORDER — PHENYLEPHRINE 40 MCG/ML (10ML) SYRINGE FOR IV PUSH (FOR BLOOD PRESSURE SUPPORT)
PREFILLED_SYRINGE | INTRAVENOUS | Status: DC | PRN
  Administered 2017-04-27: 40 ug via INTRAVENOUS

## 2017-04-27 MED ORDER — SUGAMMADEX SODIUM 200 MG/2ML IV SOLN
INTRAVENOUS | Status: DC | PRN
  Administered 2017-04-27: 170 mg via INTRAVENOUS

## 2017-04-27 MED ORDER — CHLORHEXIDINE GLUCONATE CLOTH 2 % EX PADS: 6.0000 | MEDICATED_PAD | Freq: Once | CUTANEOUS | Status: DC

## 2017-04-27 MED ORDER — SODIUM CHLORIDE 0.9 % IJ SOLN
INTRAMUSCULAR | Status: DC | PRN
  Administered 2017-04-27: 10 mL

## 2017-04-27 MED ORDER — DEXAMETHASONE SODIUM PHOSPHATE 10 MG/ML IJ SOLN
INTRAMUSCULAR | Status: DC | PRN
  Administered 2017-04-27: 10 mg via INTRAVENOUS

## 2017-04-27 MED ORDER — FENTANYL CITRATE (PF) 250 MCG/5ML IJ SOLN
INTRAMUSCULAR | Status: AC
  Filled 2017-04-27: qty 5

## 2017-04-27 MED ORDER — ROCURONIUM BROMIDE 10 MG/ML (PF) SYRINGE
PREFILLED_SYRINGE | INTRAVENOUS | Status: AC
  Filled 2017-04-27: qty 5

## 2017-04-27 MED ORDER — LACTATED RINGERS IV SOLN
INTRAVENOUS | Status: DC
Start: 2017-04-27 — End: 2017-04-27
  Administered 2017-04-27: 07:00:00 via INTRAVENOUS

## 2017-04-27 MED ORDER — LIDOCAINE 2% (20 MG/ML) 5 ML SYRINGE
INTRAMUSCULAR | Status: DC | PRN
  Administered 2017-04-27: 60 mg via INTRAVENOUS

## 2017-04-27 MED ORDER — FENTANYL CITRATE (PF) 100 MCG/2ML IJ SOLN
INTRAMUSCULAR | Status: DC | PRN
  Administered 2017-04-27: 150 ug via INTRAVENOUS

## 2017-04-27 MED ORDER — OXYCODONE HCL 5 MG PO TABS: 5.0000 mg | ORAL_TABLET | Freq: Four times a day (QID) | ORAL | 0 refills | Status: DC | PRN

## 2017-04-27 MED ORDER — PROPOFOL 10 MG/ML IV BOLUS
INTRAVENOUS | Status: DC | PRN
  Administered 2017-04-27: 60 mg via INTRAVENOUS
  Administered 2017-04-27: 20 mg via INTRAVENOUS
  Administered 2017-04-27: 100 mg via INTRAVENOUS

## 2017-04-27 MED ORDER — CEFOTETAN DISODIUM-DEXTROSE 2-2.08 GM-%(50ML) IV SOLR
INTRAVENOUS | Status: AC
  Filled 2017-04-27: qty 50

## 2017-04-27 MED ORDER — CEFOTETAN DISODIUM-DEXTROSE 2-2.08 GM-%(50ML) IV SOLR
2.0000 g | INTRAVENOUS | Status: AC
  Administered 2017-04-27: 2 g via INTRAVENOUS

## 2017-04-27 MED ORDER — ONDANSETRON HCL 4 MG/2ML IJ SOLN
INTRAMUSCULAR | Status: AC
  Filled 2017-04-27: qty 2

## 2017-04-27 MED ORDER — PROPOFOL 10 MG/ML IV BOLUS
INTRAVENOUS | Status: AC
  Filled 2017-04-27: qty 20

## 2017-04-27 MED ORDER — HEMOSTATIC AGENTS (NO CHARGE) OPTIME
TOPICAL | Status: DC | PRN
  Administered 2017-04-27 (×2): 1

## 2017-04-27 MED ORDER — DEXAMETHASONE SODIUM PHOSPHATE 10 MG/ML IJ SOLN
INTRAMUSCULAR | Status: AC
  Filled 2017-04-27: qty 1

## 2017-04-27 MED ORDER — PHENYLEPHRINE 40 MCG/ML (10ML) SYRINGE FOR IV PUSH (FOR BLOOD PRESSURE SUPPORT)
PREFILLED_SYRINGE | INTRAVENOUS | Status: AC
  Filled 2017-04-27: qty 10

## 2017-04-27 MED ORDER — THROMBIN 20000 UNITS EX KIT
PACK | CUTANEOUS | Status: AC
  Filled 2017-04-27: qty 1

## 2017-04-27 MED ORDER — BUPIVACAINE LIPOSOME 1.3 % IJ SUSP
20.0000 mL | INTRAMUSCULAR | Status: AC
  Administered 2017-04-27: 20 mL
  Filled 2017-04-27: qty 20

## 2017-04-27 SURGICAL SUPPLY — 42 items
BRIEF STRETCH FOR OB PAD LRG (UNDERPADS AND DIAPERS) ×1 IMPLANT
CANISTER SUCT 3000ML PPV (MISCELLANEOUS) ×2 IMPLANT
COVER MAYO STAND STRL (DRAPES) ×2 IMPLANT
COVER SURGICAL LIGHT HANDLE (MISCELLANEOUS) ×2 IMPLANT
DRAPE UTILITY XL STRL (DRAPES) ×4 IMPLANT
ELECT CAUTERY BLADE 6.4 (BLADE) ×2 IMPLANT
ELECT REM PT RETURN 9FT ADLT (ELECTROSURGICAL) ×2
ELECTRODE REM PT RTRN 9FT ADLT (ELECTROSURGICAL) ×1 IMPLANT
GAUZE SPONGE 4X4 12PLY STRL (GAUZE/BANDAGES/DRESSINGS) ×2 IMPLANT
GAUZE SPONGE 4X4 12PLY STRL LF (GAUZE/BANDAGES/DRESSINGS) ×1 IMPLANT
GAUZE SPONGE 4X4 16PLY XRAY LF (GAUZE/BANDAGES/DRESSINGS) ×3 IMPLANT
GLOVE BIO SURGEON STRL SZ8 (GLOVE) ×2 IMPLANT
GLOVE BIOGEL PI IND STRL 8 (GLOVE) ×1 IMPLANT
GLOVE BIOGEL PI INDICATOR 8 (GLOVE) ×1
GOWN STRL REUS W/ TWL LRG LVL3 (GOWN DISPOSABLE) ×1 IMPLANT
GOWN STRL REUS W/ TWL XL LVL3 (GOWN DISPOSABLE) ×1 IMPLANT
GOWN STRL REUS W/TWL LRG LVL3 (GOWN DISPOSABLE) ×2
GOWN STRL REUS W/TWL XL LVL3 (GOWN DISPOSABLE) ×2
KIT BASIN OR (CUSTOM PROCEDURE TRAY) ×2 IMPLANT
KIT ROOM TURNOVER OR (KITS) ×2 IMPLANT
NEEDLE 22X1 1/2 (OR ONLY) (NEEDLE) ×2 IMPLANT
NS IRRIG 1000ML POUR BTL (IV SOLUTION) ×2 IMPLANT
PACK LITHOTOMY IV (CUSTOM PROCEDURE TRAY) ×2 IMPLANT
PAD ARMBOARD 7.5X6 YLW CONV (MISCELLANEOUS) ×4 IMPLANT
PENCIL BUTTON HOLSTER BLD 10FT (ELECTRODE) ×2 IMPLANT
SHEARS HARMONIC 9CM CVD (BLADE) ×2 IMPLANT
SPECIMEN JAR SMALL (MISCELLANEOUS) ×2 IMPLANT
SPONGE HEMORRHOID 8X3CM (HEMOSTASIS) ×1 IMPLANT
SPONGE SURGIFOAM ABS GEL 100 (HEMOSTASIS) ×1 IMPLANT
SURGILUBE 2OZ TUBE FLIPTOP (MISCELLANEOUS) ×2 IMPLANT
SUT CHROMIC 2 0 SH (SUTURE) ×2 IMPLANT
SUT CHROMIC 3 0 SH 27 (SUTURE) ×2 IMPLANT
SUT VIC AB 3-0 SH 27 (SUTURE) ×8
SUT VIC AB 3-0 SH 27X BRD (SUTURE) IMPLANT
SYR CONTROL 10ML LL (SYRINGE) ×2 IMPLANT
TOWEL OR 17X24 6PK STRL BLUE (TOWEL DISPOSABLE) ×4 IMPLANT
TRAY CATH 16FR W/PLASTIC CATH (SET/KITS/TRAYS/PACK) ×1 IMPLANT
TRAY PROCTOSCOPIC FIBER OPTIC (SET/KITS/TRAYS/PACK) IMPLANT
TUBE CONNECTING 12X1/4 (SUCTIONS) ×2 IMPLANT
UNDERPAD 30X30 (UNDERPADS AND DIAPERS) ×2 IMPLANT
WATER STERILE IRR 1000ML POUR (IV SOLUTION) IMPLANT
YANKAUER SUCT BULB TIP NO VENT (SUCTIONS) ×2 IMPLANT

## 2017-04-27 NOTE — Transfer of Care (Signed)
Immediate Anesthesia Transfer of Care Note  Patient: David Odom  Procedure(s) Performed: INTERNAL AND EXTERNAL HEMORRHOIDECTOMY (N/A Rectum) EXCISION ANAL  SKIN TAGS (N/A Rectum)  Patient Location: PACU  Anesthesia Type:General  Level of Consciousness: awake, alert  and oriented  Airway & Oxygen Therapy: Patient Spontanous Breathing  Post-op Assessment: Report given to RN, Post -op Vital signs reviewed and stable and Patient moving all extremities X 4  Post vital signs: Reviewed and stable  Last Vitals:  Vitals:   04/27/17 0713  BP: (!) 141/78  Pulse: 90  Resp: 20  Temp: 36.8 C  SpO2: 100%    Last Pain:  Vitals:   04/27/17 0713  TempSrc: Oral      Patients Stated Pain Goal: 4 (04/27/17 0713)  Complications: No apparent anesthesia complications

## 2017-04-27 NOTE — Anesthesia Postprocedure Evaluation (Signed)
Anesthesia Post Note  Patient: Marvetta Gibbonslan D Harshbarger  Procedure(s) Performed: INTERNAL AND EXTERNAL HEMORRHOIDECTOMY (N/A Rectum) EXCISION ANAL  SKIN TAGS (N/A Rectum)     Patient location during evaluation: PACU Anesthesia Type: General Level of consciousness: awake and alert and oriented Pain management: pain level controlled Vital Signs Assessment: post-procedure vital signs reviewed and stable Respiratory status: spontaneous breathing, nonlabored ventilation and respiratory function stable Cardiovascular status: blood pressure returned to baseline and stable Postop Assessment: no apparent nausea or vomiting Anesthetic complications: no    Last Vitals:  Vitals:   04/27/17 1038 04/27/17 1039  BP:  135/78  Pulse:  76  Resp:  13  Temp: 36.5 C   SpO2:  100%    Last Pain:  Vitals:   04/27/17 0713  TempSrc: Oral                 Jolonda Gomm A.

## 2017-04-27 NOTE — Interval H&P Note (Signed)
History and Physical Interval Note:  04/27/2017 8:16 AM  David Odom  has presented today for surgery, with the diagnosis of internal and external hemorrhoids with anal skin tags  The various methods of treatment have been discussed with the patient and family. After consideration of risks, benefits and other options for treatment, the patient has consented to  Procedure(s): INTERNAL AND EXTERNAL HEMORRHOIDECTOMY (N/A) EXCISION ANAL  SKIN TAGS (N/A) as a surgical intervention .  The patient's history has been reviewed, patient examined, no change in status, stable for surgery.  I have reviewed the patient's chart and labs.  Questions were answered to the patient's satisfaction.     [name] E
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
## 2017-04-27 NOTE — Op Note (Signed)
04/27/2017  9:57 AM  PATIENT:  David GibbonsAlan D Odom  56 y.o. male  PRE-OPERATIVE DIAGNOSIS:  internal and external hemorrhoids with anal skin tags  POST-OPERATIVE DIAGNOSIS:  internal and external hemorrhoids with anal skin tags  PROCEDURE:  Procedure(s): Internal hemorrhoidectomy, 2 column External hemorrhoidectomy x1 Excision of anal skin tags  SURGEON:  Surgeon(s): Violeta Gelinashompson, Margaret Staggs, MD  ASSISTANTS: none   ANESTHESIA:   local and general  EBL:  Total I/O In: 800 [I.V.:800] Out: 120 [Urine:100; Blood:20]  BLOOD ADMINISTERED:none  DRAINS: none   SPECIMEN:  Excision  DISPOSITION OF SPECIMEN:  PATHOLOGY  COUNTS:  YES  DICTATION: .Dragon Dictation Findings: 2 internal hemorrhoids, one large external hemorrhoid, circumferential large anal skin tags  Procedure in detail: [name] was identified in the preop holding area.  Informed consent was obtained.  He received intravenous antibiotics.  He was brought to the operating room and general anesthesia was administered by the anesthesia staff.  He was placed in lithotomy position and his anal area was prepped and draped in a sterile fashion.  Timeout procedure was performed.  I then injected Exparel circumferentially around the anal area for postoperative pain relief.  Examination under anesthesia was then  performed.  Digital rectal exam revealed the internal hemorrhoids as expected and a large anterior external hemorrhoid.  Anoscopy was then performed confirming the above.  Attention was first directed to the external hemorrhoid.  The skin was incised and then the external hemorrhoid was removed using harmonic scalpel.  I stayed well superficial to the sphincters.  It was sent to pathology.  Hemostasis was ensured with cautery.  Next, in a similar fashion both of the internal hemorrhoids were removed.  One around the 10 o'clock position and one around the 5 o'clock position.  In each case the overlying mucosa was incised and the hemorrhoid  was removed using harmonic scalpel.  I was sure to remain superficial to the sphincters which were easily palpable hemostasis was obtained using cautery in both.  They were sent to pathology.  Next, after ensuring good hemostasis from the hemorrhoidectomies, I removed his circumferential anal skin tags in 4 quadrants.  The skin was incised and they were removed using the harmonic and Bovie.  They were sent to pathology.  Next, I used running 3-0 Vicryl sutures to reconstruct the anoderm.  Each of the wounds were closed and came together nicely.  Hemostasis was ensured.  I then placed an endorectal dressing of a Gelfoam sponge soaked in thrombin.  A sterile dressing was applied.  All counts were correct.  He tolerated the procedure well without apparent complication.  The staff performed an in and out catheterization.  He was then taken recovery in stable condition. PATIENT DISPOSITION:  PACU - hemodynamically stable.   Delay start of Pharmacological VTE agent (>24hrs) due to surgical blood loss or risk of bleeding:  no  Violeta GelinasBurke Roldan Laforest, MD, MPH, FACS Pager: [phone]  11/12/20189:57 AM

## 2017-04-27 NOTE — Anesthesia Procedure Notes (Signed)
Procedure Name: Intubation Date/Time: 04/27/2017 8:53 AM Performed by: Nils PyleBell, Latorie Montesano T, CRNA Pre-anesthesia Checklist: Patient identified, Emergency Drugs available, Suction available and Patient being monitored Patient Re-evaluated:Patient Re-evaluated prior to induction Oxygen Delivery Method: Circle System Utilized Preoxygenation: Pre-oxygenation with 100% oxygen Induction Type: IV induction Ventilation: Mask ventilation without difficulty and Oral airway inserted - appropriate to patient size Laryngoscope Size: Hyacinth MeekerMiller and 2 Grade View: Grade I Tube type: Oral Tube size: 7.5 mm Number of attempts: 1 Airway Equipment and Method: Stylet and Oral airway Placement Confirmation: ETT inserted through vocal cords under direct vision,  positive ETCO2 and breath sounds checked- equal and bilateral Secured at: 23 cm Tube secured with: Tape Dental Injury: Teeth and Oropharynx as per pre-operative assessment
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
## 2017-04-28 ENCOUNTER — Encounter (HOSPITAL_COMMUNITY): Payer: Self-pay | Admitting: General Surgery

## 2017-06-01 DIAGNOSIS — R32 Unspecified urinary incontinence: Secondary | ICD-10-CM | POA: Diagnosis not present

## 2017-06-01 DIAGNOSIS — R972 Elevated prostate specific antigen [PSA]: Secondary | ICD-10-CM | POA: Diagnosis not present

## 2017-06-01 DIAGNOSIS — N319 Neuromuscular dysfunction of bladder, unspecified: Secondary | ICD-10-CM | POA: Diagnosis not present

## 2017-06-01 DIAGNOSIS — N411 Chronic prostatitis: Secondary | ICD-10-CM | POA: Diagnosis not present

## 2017-07-16 DIAGNOSIS — R972 Elevated prostate specific antigen [PSA]: Secondary | ICD-10-CM | POA: Diagnosis not present

## 2017-07-23 DIAGNOSIS — R972 Elevated prostate specific antigen [PSA]: Secondary | ICD-10-CM | POA: Diagnosis not present

## 2017-07-23 DIAGNOSIS — R339 Retention of urine, unspecified: Secondary | ICD-10-CM | POA: Diagnosis not present

## 2017-09-29 DIAGNOSIS — N319 Neuromuscular dysfunction of bladder, unspecified: Secondary | ICD-10-CM | POA: Diagnosis not present

## 2017-09-29 DIAGNOSIS — E1140 Type 2 diabetes mellitus with diabetic neuropathy, unspecified: Secondary | ICD-10-CM | POA: Diagnosis not present

## 2017-09-29 DIAGNOSIS — G8220 Paraplegia, unspecified: Secondary | ICD-10-CM | POA: Diagnosis not present

## 2017-09-29 DIAGNOSIS — G4733 Obstructive sleep apnea (adult) (pediatric): Secondary | ICD-10-CM | POA: Diagnosis not present

## 2018-01-19 DIAGNOSIS — R972 Elevated prostate specific antigen [PSA]: Secondary | ICD-10-CM | POA: Diagnosis not present

## 2018-01-28 DIAGNOSIS — N319 Neuromuscular dysfunction of bladder, unspecified: Secondary | ICD-10-CM | POA: Diagnosis not present

## 2018-01-28 DIAGNOSIS — R972 Elevated prostate specific antigen [PSA]: Secondary | ICD-10-CM | POA: Diagnosis not present

## 2018-02-16 DIAGNOSIS — R300 Dysuria: Secondary | ICD-10-CM | POA: Diagnosis not present

## 2018-04-08 DIAGNOSIS — R5081 Fever presenting with conditions classified elsewhere: Secondary | ICD-10-CM | POA: Diagnosis not present

## 2018-04-08 DIAGNOSIS — R8290 Unspecified abnormal findings in urine: Secondary | ICD-10-CM | POA: Diagnosis not present

## 2018-05-03 DIAGNOSIS — D500 Iron deficiency anemia secondary to blood loss (chronic): Secondary | ICD-10-CM | POA: Diagnosis not present

## 2018-05-03 DIAGNOSIS — E1140 Type 2 diabetes mellitus with diabetic neuropathy, unspecified: Secondary | ICD-10-CM | POA: Diagnosis not present

## 2018-05-03 DIAGNOSIS — Z1389 Encounter for screening for other disorder: Secondary | ICD-10-CM | POA: Diagnosis not present

## 2018-05-03 DIAGNOSIS — Z125 Encounter for screening for malignant neoplasm of prostate: Secondary | ICD-10-CM | POA: Diagnosis not present

## 2018-05-03 DIAGNOSIS — G8220 Paraplegia, unspecified: Secondary | ICD-10-CM | POA: Diagnosis not present

## 2018-05-03 DIAGNOSIS — Z0000 Encounter for general adult medical examination without abnormal findings: Secondary | ICD-10-CM | POA: Diagnosis not present

## 2018-05-03 DIAGNOSIS — N319 Neuromuscular dysfunction of bladder, unspecified: Secondary | ICD-10-CM | POA: Diagnosis not present

## 2018-05-03 DIAGNOSIS — Z23 Encounter for immunization: Secondary | ICD-10-CM | POA: Diagnosis not present

## 2018-05-27 ENCOUNTER — Ambulatory Visit
Admission: RE | Admit: 2018-05-27 | Discharge: 2018-05-27 | Disposition: A | Discharge status: Home / Self Care | Payer: Medicare Other | Source: Ambulatory Visit | Attending: Internal Medicine | Admitting: Internal Medicine

## 2018-05-27 ENCOUNTER — Other Ambulatory Visit: Payer: Self-pay | Admitting: Internal Medicine

## 2018-05-27 DIAGNOSIS — M25552 Pain in left hip: Secondary | ICD-10-CM | POA: Diagnosis not present

## 2018-06-02 ENCOUNTER — Ambulatory Visit (INDEPENDENT_AMBULATORY_CARE_PROVIDER_SITE_OTHER): Payer: Medicare Other | Admitting: Orthopaedic Surgery

## 2018-06-02 ENCOUNTER — Encounter (INDEPENDENT_AMBULATORY_CARE_PROVIDER_SITE_OTHER): Payer: Self-pay | Admitting: Orthopaedic Surgery

## 2018-06-02 DIAGNOSIS — M7062 Trochanteric bursitis, left hip: Secondary | ICD-10-CM | POA: Diagnosis not present

## 2018-06-02 MED ORDER — LIDOCAINE HCL 1 % IJ SOLN
3.0000 mL | INTRAMUSCULAR | Status: AC | PRN
  Administered 2018-06-02: 3 mL

## 2018-06-02 MED ORDER — METHYLPREDNISOLONE ACETATE 40 MG/ML IJ SUSP
40.0000 mg | INTRAMUSCULAR | Status: AC | PRN
  Administered 2018-06-02: 40 mg via INTRA_ARTICULAR
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
## 2018-08-13 DIAGNOSIS — N319 Neuromuscular dysfunction of bladder, unspecified: Secondary | ICD-10-CM | POA: Diagnosis not present

## 2018-08-13 DIAGNOSIS — N411 Chronic prostatitis: Secondary | ICD-10-CM | POA: Diagnosis not present

## 2018-08-13 DIAGNOSIS — N3941 Urge incontinence: Secondary | ICD-10-CM | POA: Diagnosis not present

## 2018-08-13 DIAGNOSIS — R972 Elevated prostate specific antigen [PSA]: Secondary | ICD-10-CM | POA: Diagnosis not present

## 2018-08-26 ENCOUNTER — Other Ambulatory Visit: Payer: Self-pay | Admitting: Urology

## 2018-08-26 MED ORDER — ONABOTULINUMTOXINA 100 UNITS IJ SOLR: 200.0000 [IU] | Freq: Once | INTRAMUSCULAR | Status: AC

## 2018-08-30 ENCOUNTER — Other Ambulatory Visit: Payer: Self-pay

## 2018-08-30 ENCOUNTER — Encounter (HOSPITAL_BASED_OUTPATIENT_CLINIC_OR_DEPARTMENT_OTHER): Payer: Self-pay | Admitting: *Deleted

## 2018-08-30 NOTE — Progress Notes (Addendum)
Spoke with patient via telephone for pre op interview. NPO after MN. Patient to take Ditropan and Septra AM of surgery. Will need ISTAT4 DOS. Arrival time 25. Patient to bring CPAP with him DOS. Pt paraplegic able to transfer self.

## 2018-09-07 ENCOUNTER — Ambulatory Visit (HOSPITAL_COMMUNITY): Admission: RE | Admit: 2018-09-07 | Payer: Medicare Other | Source: Home / Self Care | Admitting: Urology

## 2018-09-07 HISTORY — DX: Major depressive disorder, single episode, unspecified: F32.9

## 2018-09-07 HISTORY — DX: Anxiety disorder, unspecified: F41.9

## 2018-09-07 SURGERY — BOTOX INJECTION
Anesthesia: Monitor Anesthesia Care

## 2018-10-25 ENCOUNTER — Other Ambulatory Visit: Payer: Self-pay | Admitting: Urology

## 2018-10-25 MED ORDER — ONABOTULINUMTOXINA 100 UNITS IJ SOLR: 200.0000 [IU] | Freq: Once | INTRAMUSCULAR | Status: AC

## 2018-11-01 ENCOUNTER — Other Ambulatory Visit: Payer: Self-pay

## 2018-11-01 ENCOUNTER — Encounter (HOSPITAL_BASED_OUTPATIENT_CLINIC_OR_DEPARTMENT_OTHER): Payer: Self-pay | Admitting: *Deleted

## 2018-11-01 NOTE — Progress Notes (Signed)
SPOKE W/  _ pt via phone     SCREENING SYMPTOMS OF COVID 19:   COUGH--  RUNNY NOSE---   SORE THROAT---  NASAL CONGESTION----  SNEEZING----  SHORTNESS OF BREATH---  DIFFICULTY BREATHING---  TEMP >100.0 -----  UNEXPLAINED BODY ACHES------  CHILLS --------   HEADACHES ---------  LOSS OF SMELL/ TASTE --------  ---- pt answered no to all above symptoms  HAVE YOU OR ANY FAMILY MEMBER TRAVELLED PAST 14 DAYS OUT OF THE   COUNTY--- no STATE---- no COUNTRY---- no  HAVE YOU OR ANY FAMILY MEMBER BEEN EXPOSED TO ANYONE WITH COVID 19?  denies

## 2018-11-01 NOTE — Progress Notes (Signed)
Spoke w/ pt via phone for pre-op interview.  Npo after mn.  Arrive at 0700.  Needs istat and ekg.  Will take oxybutynin am dos w/ sips of water.  Pt is paraplegic.  Pt is able to transfer self, to bring his transfer board.  Asked pt to bring cpap mask and tubing.  Pt getting covid test done Friday 11-05-2018 @ 1315.

## 2018-11-05 ENCOUNTER — Other Ambulatory Visit (HOSPITAL_COMMUNITY)
Admission: RE | Admit: 2018-11-05 | Discharge: 2018-11-05 | Disposition: A | Discharge status: Home / Self Care | Payer: Medicare Other | Source: Ambulatory Visit | Attending: Urology | Admitting: Urology

## 2018-11-05 DIAGNOSIS — Z1159 Encounter for screening for other viral diseases: Secondary | ICD-10-CM | POA: Diagnosis not present

## 2018-11-06 LAB — NOVEL CORONAVIRUS, NAA (HOSP ORDER, SEND-OUT TO REF LAB; TAT 18-24 HRS): SARS-CoV-2, NAA: NOT DETECTED

## 2018-11-09 NOTE — Progress Notes (Signed)
SPOKE W/  _alan Isenberg     SCREENING SYMPTOMS OF COVID 19:   COUGH--no  RUNNY NOSE---no   SORE THROAT---no  NASAL CONGESTION----no  SNEEZING----no  SHORTNESS OF BREATH---no  DIFFICULTY BREATHING---no TEMP >100.0 -----  UNEXPLAINED BODY ACHES------no  CHILLS -------- no HEADACHES ---------no LOSS OF SMELL/ TASTE --------no    HAVE YOU OR ANY FAMILY MEMBER TRAVELLED PAST 14 DAYS OUT OF THE   COUNTY---no STATE----no COUNTRY----no  HAVE YOU OR ANY FAMILY MEMBER BEEN EXPOSED TO ANYONE WITH COVID 19? no

## 2018-11-10 ENCOUNTER — Other Ambulatory Visit: Payer: Self-pay

## 2018-11-10 ENCOUNTER — Ambulatory Visit (HOSPITAL_BASED_OUTPATIENT_CLINIC_OR_DEPARTMENT_OTHER)
Admission: RE | Admit: 2018-11-10 | Discharge: 2018-11-10 | Disposition: A | Discharge status: Home / Self Care | Payer: Medicare Other | Attending: Urology | Admitting: Urology

## 2018-11-10 ENCOUNTER — Ambulatory Visit (HOSPITAL_BASED_OUTPATIENT_CLINIC_OR_DEPARTMENT_OTHER): Payer: Medicare Other | Admitting: Anesthesiology

## 2018-11-10 ENCOUNTER — Encounter (HOSPITAL_BASED_OUTPATIENT_CLINIC_OR_DEPARTMENT_OTHER): Payer: Self-pay | Admitting: Anesthesiology

## 2018-11-10 ENCOUNTER — Encounter (HOSPITAL_BASED_OUTPATIENT_CLINIC_OR_DEPARTMENT_OTHER)
Admission: RE | Disposition: A | Discharge status: Home / Self Care | Payer: Self-pay | Source: Home / Self Care | Attending: Urology

## 2018-11-10 DIAGNOSIS — N319 Neuromuscular dysfunction of bladder, unspecified: Secondary | ICD-10-CM | POA: Insufficient documentation

## 2018-11-10 DIAGNOSIS — N411 Chronic prostatitis: Secondary | ICD-10-CM | POA: Diagnosis not present

## 2018-11-10 DIAGNOSIS — K5900 Constipation, unspecified: Secondary | ICD-10-CM | POA: Diagnosis not present

## 2018-11-10 DIAGNOSIS — R972 Elevated prostate specific antigen [PSA]: Secondary | ICD-10-CM | POA: Insufficient documentation

## 2018-11-10 DIAGNOSIS — D509 Iron deficiency anemia, unspecified: Secondary | ICD-10-CM | POA: Diagnosis not present

## 2018-11-10 DIAGNOSIS — G8220 Paraplegia, unspecified: Secondary | ICD-10-CM | POA: Insufficient documentation

## 2018-11-10 DIAGNOSIS — N3941 Urge incontinence: Secondary | ICD-10-CM | POA: Diagnosis not present

## 2018-11-10 DIAGNOSIS — F418 Other specified anxiety disorders: Secondary | ICD-10-CM | POA: Diagnosis not present

## 2018-11-10 DIAGNOSIS — K625 Hemorrhage of anus and rectum: Secondary | ICD-10-CM | POA: Diagnosis not present

## 2018-11-10 DIAGNOSIS — G4730 Sleep apnea, unspecified: Secondary | ICD-10-CM | POA: Insufficient documentation

## 2018-11-10 DIAGNOSIS — E119 Type 2 diabetes mellitus without complications: Secondary | ICD-10-CM | POA: Diagnosis not present

## 2018-11-10 HISTORY — DX: Presence of spectacles and contact lenses: Z97.3

## 2018-11-10 HISTORY — DX: Neuromuscular dysfunction of bladder, unspecified: N31.9

## 2018-11-10 HISTORY — PX: BOTOX INJECTION: SHX5754

## 2018-11-10 HISTORY — DX: Type 2 diabetes mellitus without complications: E11.9

## 2018-11-10 HISTORY — DX: Other bursitis of hip, unspecified hip: M70.70

## 2018-11-10 HISTORY — DX: Other muscle spasm: M62.838

## 2018-11-10 HISTORY — DX: Other specified disorders of bladder: N32.89

## 2018-11-10 LAB — POCT I-STAT 4, (NA,K, GLUC, HGB,HCT)
Glucose, Bld: 88 mg/dL (ref 70–99)
HCT: 45 % (ref 39.0–52.0)
Hemoglobin: 15.3 g/dL (ref 13.0–17.0)
Potassium: 3.7 mmol/L (ref 3.5–5.1)
Sodium: 142 mmol/L (ref 135–145)

## 2018-11-10 LAB — GLUCOSE, CAPILLARY: Glucose-Capillary: 81 mg/dL (ref 70–99)

## 2018-11-10 SURGERY — BOTOX INJECTION
Anesthesia: Monitor Anesthesia Care | Site: Bladder

## 2018-11-10 MED ORDER — ONABOTULINUMTOXINA 100 UNITS IJ SOLR
INTRAMUSCULAR | Status: DC | PRN
  Administered 2018-11-10: 200 [IU] via INTRAMUSCULAR

## 2018-11-10 MED ORDER — DEXAMETHASONE SODIUM PHOSPHATE 10 MG/ML IJ SOLN
INTRAMUSCULAR | Status: AC
  Filled 2018-11-10: qty 1

## 2018-11-10 MED ORDER — ONABOTULINUMTOXINA 100 UNITS IJ SOLR
INTRAMUSCULAR | Status: AC
  Filled 2018-11-10: qty 100

## 2018-11-10 MED ORDER — OXYCODONE HCL 5 MG/5ML PO SOLN
5.0000 mg | Freq: Once | ORAL | Status: DC | PRN
  Filled 2018-11-10: qty 5

## 2018-11-10 MED ORDER — LIDOCAINE 2% (20 MG/ML) 5 ML SYRINGE
INTRAMUSCULAR | Status: AC
  Filled 2018-11-10: qty 5

## 2018-11-10 MED ORDER — FENTANYL CITRATE (PF) 100 MCG/2ML IJ SOLN
INTRAMUSCULAR | Status: DC | PRN
  Administered 2018-11-10: 50 ug via INTRAVENOUS

## 2018-11-10 MED ORDER — FENTANYL CITRATE (PF) 100 MCG/2ML IJ SOLN
INTRAMUSCULAR | Status: AC
  Filled 2018-11-10: qty 2

## 2018-11-10 MED ORDER — STERILE WATER FOR IRRIGATION IR SOLN
Status: DC | PRN
  Administered 2018-11-10: 3000 mL

## 2018-11-10 MED ORDER — HYDROMORPHONE HCL 1 MG/ML IJ SOLN
0.2500 mg | INTRAMUSCULAR | Status: DC | PRN
  Filled 2018-11-10: qty 0.5

## 2018-11-10 MED ORDER — LACTATED RINGERS IV SOLN
INTRAVENOUS | Status: DC
  Administered 2018-11-10: 08:00:00 via INTRAVENOUS
  Filled 2018-11-10: qty 1000

## 2018-11-10 MED ORDER — PROPOFOL 500 MG/50ML IV EMUL
INTRAVENOUS | Status: DC | PRN
  Administered 2018-11-10: 50 ug/kg/min via INTRAVENOUS

## 2018-11-10 MED ORDER — DEXAMETHASONE SODIUM PHOSPHATE 4 MG/ML IJ SOLN
INTRAMUSCULAR | Status: DC | PRN
  Administered 2018-11-10: 10 mg via INTRAVENOUS

## 2018-11-10 MED ORDER — MIDAZOLAM HCL 2 MG/2ML IJ SOLN
INTRAMUSCULAR | Status: AC
  Filled 2018-11-10: qty 2

## 2018-11-10 MED ORDER — CEFAZOLIN SODIUM-DEXTROSE 2-4 GM/100ML-% IV SOLN
INTRAVENOUS | Status: AC
  Filled 2018-11-10: qty 100

## 2018-11-10 MED ORDER — MIDAZOLAM HCL 5 MG/5ML IJ SOLN
INTRAMUSCULAR | Status: DC | PRN
  Administered 2018-11-10: 2 mg via INTRAVENOUS

## 2018-11-10 MED ORDER — ONDANSETRON HCL 4 MG/2ML IJ SOLN
INTRAMUSCULAR | Status: AC
  Filled 2018-11-10: qty 2

## 2018-11-10 MED ORDER — SODIUM CHLORIDE (PF) 0.9 % IJ SOLN
INTRAMUSCULAR | Status: DC | PRN
  Administered 2018-11-10: 20 mL

## 2018-11-10 MED ORDER — ONDANSETRON HCL 4 MG/2ML IJ SOLN
INTRAMUSCULAR | Status: DC | PRN
  Administered 2018-11-10: 4 mg via INTRAVENOUS

## 2018-11-10 MED ORDER — OXYCODONE HCL 5 MG PO TABS
5.0000 mg | ORAL_TABLET | Freq: Once | ORAL | Status: DC | PRN
  Filled 2018-11-10: qty 1

## 2018-11-10 MED ORDER — SODIUM CHLORIDE (PF) 0.9 % IJ SOLN
INTRAMUSCULAR | Status: AC
  Filled 2018-11-10: qty 50

## 2018-11-10 MED ORDER — LIDOCAINE HCL (CARDIAC) PF 100 MG/5ML IV SOSY
PREFILLED_SYRINGE | INTRAVENOUS | Status: DC | PRN
  Administered 2018-11-10: 60 mg via INTRAVENOUS

## 2018-11-10 MED ORDER — CEFAZOLIN SODIUM-DEXTROSE 2-4 GM/100ML-% IV SOLN
2.0000 g | Freq: Once | INTRAVENOUS | Status: AC
  Administered 2018-11-10: 2 g via INTRAVENOUS
  Filled 2018-11-10: qty 100

## 2018-11-10 MED ORDER — PROPOFOL 500 MG/50ML IV EMUL
INTRAVENOUS | Status: AC
  Filled 2018-11-10: qty 50

## 2018-11-10 MED ORDER — PROMETHAZINE HCL 25 MG/ML IJ SOLN
6.2500 mg | INTRAMUSCULAR | Status: DC | PRN
  Filled 2018-11-10: qty 1

## 2018-11-10 SURGICAL SUPPLY — 21 items
BAG DRAIN URO-CYSTO SKYTR STRL (DRAIN) ×3 IMPLANT
BAG DRN UROCATH (DRAIN) ×1
CATH ROBINSON RED A/P 14FR (CATHETERS) IMPLANT
CLOTH BEACON ORANGE TIMEOUT ST (SAFETY) ×3 IMPLANT
ELECT REM PT RETURN 9FT ADLT (ELECTROSURGICAL) ×3
ELECTRODE REM PT RTRN 9FT ADLT (ELECTROSURGICAL) ×1 IMPLANT
GLOVE BIO SURGEON STRL SZ7.5 (GLOVE) ×9 IMPLANT
GOWN STRL REUS W/ TWL XL LVL3 (GOWN DISPOSABLE) ×3 IMPLANT
GOWN STRL REUS W/TWL XL LVL3 (GOWN DISPOSABLE) ×12
KIT TURNOVER CYSTO (KITS) ×3 IMPLANT
MANIFOLD NEPTUNE II (INSTRUMENTS) ×3 IMPLANT
NDL ASPIRATION 22 (NEEDLE) IMPLANT
NDL HYPO 18GX1.5 BLUNT FILL (NEEDLE) IMPLANT
NEEDLE ASPIRATION 22 (NEEDLE) ×3 IMPLANT
NEEDLE HYPO 18GX1.5 BLUNT FILL (NEEDLE) ×3 IMPLANT
PACK CYSTO (CUSTOM PROCEDURE TRAY) ×3 IMPLANT
SYR 20CC LL (SYRINGE) IMPLANT
SYR CONTROL 10ML LL (SYRINGE) ×2 IMPLANT
TUBE CONNECTING 12'X1/4 (SUCTIONS) ×1
TUBE CONNECTING 12X1/4 (SUCTIONS) ×2 IMPLANT
WATER STERILE IRR 3000ML UROMA (IV SOLUTION) ×3 IMPLANT
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
## 2018-11-10 NOTE — Discharge Instructions (Signed)

## 2018-11-10 NOTE — Anesthesia Procedure Notes (Signed)
Procedure Name: MAC Date/Time: 11/10/2018 9:21 AM Performed by: Lieutenant Diego, CRNA Pre-anesthesia Checklist: Patient identified, Emergency Drugs available, Suction available, Patient being monitored and Timeout performed Patient Re-evaluated:Patient Re-evaluated prior to induction Oxygen Delivery Method: Simple face mask Induction Type: IV induction
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
## 2018-11-10 NOTE — Transfer of Care (Signed)
Immediate Anesthesia Transfer of Care Note  Patient: David Odom  Procedure(s) Performed: BOTOX INJECTION WITH CYSTOSCOPY (N/A Bladder)  Patient Location: PACU  Anesthesia Type:MAC  Level of Consciousness: awake  Airway & Oxygen Therapy: Patient Spontanous Breathing and Patient connected to face mask oxygen  Post-op Assessment: Report given to RN and Post -op Vital signs reviewed and stable  Post vital signs: Reviewed and stable  Last Vitals:  Vitals Value Taken Time  BP 122/77 11/10/2018  9:46 AM  Temp    Pulse 88 11/10/2018  9:48 AM  Resp 12 11/10/2018  9:48 AM  SpO2 100 % 11/10/2018  9:48 AM  Vitals shown include unvalidated device data.  Last Pain:  Vitals:   11/10/18 0700  TempSrc: Oral  PainSc: 5          Complications: No apparent anesthesia complications

## 2018-11-10 NOTE — Anesthesia Postprocedure Evaluation (Signed)
Anesthesia Post Note  Patient: David Odom  Procedure(s) Performed: BOTOX INJECTION WITH CYSTOSCOPY AND FULGERATION (N/A Bladder)     Patient location during evaluation: PACU Anesthesia Type: MAC Level of consciousness: awake and alert Pain management: pain level controlled Vital Signs Assessment: post-procedure vital signs reviewed and stable Respiratory status: spontaneous breathing, nonlabored ventilation and respiratory function stable Cardiovascular status: stable and blood pressure returned to baseline Postop Assessment: no apparent nausea or vomiting Anesthetic complications: no    Last Vitals:  Vitals:   11/10/18 1015 11/10/18 1040  BP: 134/73 132/75  Pulse: 86 86  Resp: 14 16  Temp:  36.6 C  SpO2: 98% 100%    Last Pain:  Vitals:   11/10/18 1040  TempSrc: Axillary  PainSc: 0-No pain                 Lynda Rainwater

## 2018-11-11 ENCOUNTER — Encounter (HOSPITAL_BASED_OUTPATIENT_CLINIC_OR_DEPARTMENT_OTHER): Payer: Self-pay | Admitting: Urology

## 2018-12-23 DIAGNOSIS — R972 Elevated prostate specific antigen [PSA]: Secondary | ICD-10-CM | POA: Diagnosis not present

## 2018-12-23 DIAGNOSIS — N3941 Urge incontinence: Secondary | ICD-10-CM | POA: Diagnosis not present

## 2018-12-23 DIAGNOSIS — N319 Neuromuscular dysfunction of bladder, unspecified: Secondary | ICD-10-CM | POA: Diagnosis not present

## 2018-12-23 DIAGNOSIS — Z125 Encounter for screening for malignant neoplasm of prostate: Secondary | ICD-10-CM | POA: Diagnosis not present

## 2019-03-25 DIAGNOSIS — N3941 Urge incontinence: Secondary | ICD-10-CM | POA: Diagnosis not present

## 2019-03-25 DIAGNOSIS — N319 Neuromuscular dysfunction of bladder, unspecified: Secondary | ICD-10-CM | POA: Diagnosis not present

## 2019-03-29 ENCOUNTER — Other Ambulatory Visit: Payer: Self-pay | Admitting: Urology

## 2019-04-23 ENCOUNTER — Other Ambulatory Visit (HOSPITAL_COMMUNITY)
Admission: RE | Admit: 2019-04-23 | Discharge: 2019-04-23 | Disposition: A | Discharge status: Home / Self Care | Payer: Medicare Other | Source: Ambulatory Visit | Attending: Urology | Admitting: Urology

## 2019-04-23 DIAGNOSIS — Z20828 Contact with and (suspected) exposure to other viral communicable diseases: Secondary | ICD-10-CM | POA: Diagnosis not present

## 2019-04-23 DIAGNOSIS — Z01812 Encounter for preprocedural laboratory examination: Secondary | ICD-10-CM | POA: Diagnosis not present

## 2019-04-24 LAB — NOVEL CORONAVIRUS, NAA (HOSP ORDER, SEND-OUT TO REF LAB; TAT 18-24 HRS): SARS-CoV-2, NAA: NOT DETECTED

## 2019-04-26 ENCOUNTER — Encounter (HOSPITAL_BASED_OUTPATIENT_CLINIC_OR_DEPARTMENT_OTHER): Payer: Self-pay | Admitting: *Deleted

## 2019-04-26 ENCOUNTER — Other Ambulatory Visit: Payer: Self-pay
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
## 2019-04-26 NOTE — Progress Notes (Signed)
Spoke with patient via telephone for pre op interview. NPO after MN. Patient to take Septra with a sip of water AM of surgery. Pt is paraplegic can transfer self. Told to bring transfer board with him. Patient to also bring CPAP DOS. Arrival time 0630.

## 2019-04-27 ENCOUNTER — Ambulatory Visit (HOSPITAL_BASED_OUTPATIENT_CLINIC_OR_DEPARTMENT_OTHER): Payer: Medicare Other | Admitting: Anesthesiology

## 2019-04-27 ENCOUNTER — Encounter (HOSPITAL_BASED_OUTPATIENT_CLINIC_OR_DEPARTMENT_OTHER)
Admission: RE | Disposition: A | Discharge status: Home / Self Care | Payer: Self-pay | Source: Home / Self Care | Attending: Urology

## 2019-04-27 ENCOUNTER — Other Ambulatory Visit: Payer: Self-pay

## 2019-04-27 ENCOUNTER — Ambulatory Visit (HOSPITAL_BASED_OUTPATIENT_CLINIC_OR_DEPARTMENT_OTHER)
Admission: RE | Admit: 2019-04-27 | Discharge: 2019-04-27 | Disposition: A | Discharge status: Home / Self Care | Payer: Medicare Other | Attending: Urology | Admitting: Urology

## 2019-04-27 ENCOUNTER — Encounter (HOSPITAL_BASED_OUTPATIENT_CLINIC_OR_DEPARTMENT_OTHER): Payer: Self-pay | Admitting: Emergency Medicine

## 2019-04-27 DIAGNOSIS — E119 Type 2 diabetes mellitus without complications: Secondary | ICD-10-CM | POA: Insufficient documentation

## 2019-04-27 DIAGNOSIS — N3941 Urge incontinence: Secondary | ICD-10-CM | POA: Insufficient documentation

## 2019-04-27 DIAGNOSIS — N319 Neuromuscular dysfunction of bladder, unspecified: Secondary | ICD-10-CM | POA: Diagnosis not present

## 2019-04-27 DIAGNOSIS — G4730 Sleep apnea, unspecified: Secondary | ICD-10-CM | POA: Insufficient documentation

## 2019-04-27 DIAGNOSIS — F418 Other specified anxiety disorders: Secondary | ICD-10-CM | POA: Diagnosis not present

## 2019-04-27 DIAGNOSIS — D649 Anemia, unspecified: Secondary | ICD-10-CM | POA: Diagnosis not present

## 2019-04-27 DIAGNOSIS — Z87828 Personal history of other (healed) physical injury and trauma: Secondary | ICD-10-CM | POA: Insufficient documentation

## 2019-04-27 DIAGNOSIS — G8220 Paraplegia, unspecified: Secondary | ICD-10-CM | POA: Diagnosis not present

## 2019-04-27 DIAGNOSIS — Z86718 Personal history of other venous thrombosis and embolism: Secondary | ICD-10-CM | POA: Insufficient documentation

## 2019-04-27 HISTORY — PX: BOTOX INJECTION: SHX5754

## 2019-04-27 LAB — GLUCOSE, CAPILLARY
Glucose-Capillary: 77 mg/dL (ref 70–99)
Glucose-Capillary: 85 mg/dL (ref 70–99)

## 2019-04-27 SURGERY — BOTOX INJECTION
Anesthesia: General | Site: Bladder

## 2019-04-27 MED ORDER — PROPOFOL 500 MG/50ML IV EMUL
INTRAVENOUS | Status: DC | PRN
  Administered 2019-04-27: 75 ug/kg/min via INTRAVENOUS

## 2019-04-27 MED ORDER — LIDOCAINE 2% (20 MG/ML) 5 ML SYRINGE
INTRAMUSCULAR | Status: AC
  Filled 2019-04-27: qty 5

## 2019-04-27 MED ORDER — DEXAMETHASONE SODIUM PHOSPHATE 10 MG/ML IJ SOLN
INTRAMUSCULAR | Status: DC | PRN
  Administered 2019-04-27: 10 mg via INTRAVENOUS

## 2019-04-27 MED ORDER — CEPHALEXIN 500 MG PO CAPS: 500.0000 mg | ORAL_CAPSULE | Freq: Two times a day (BID) | ORAL | 0 refills | Status: AC

## 2019-04-27 MED ORDER — OXYCODONE HCL 5 MG PO TABS
5.0000 mg | ORAL_TABLET | Freq: Once | ORAL | Status: DC | PRN
  Filled 2019-04-27: qty 1

## 2019-04-27 MED ORDER — MEPERIDINE HCL 25 MG/ML IJ SOLN
6.2500 mg | INTRAMUSCULAR | Status: DC | PRN
  Filled 2019-04-27: qty 1

## 2019-04-27 MED ORDER — LACTATED RINGERS IV SOLN
INTRAVENOUS | Status: DC
  Administered 2019-04-27: 07:00:00 via INTRAVENOUS
  Filled 2019-04-27: qty 1000

## 2019-04-27 MED ORDER — ONABOTULINUMTOXINA 100 UNITS IJ SOLR
INTRAMUSCULAR | Status: DC | PRN
  Administered 2019-04-27: 200 [IU] via INTRAMUSCULAR

## 2019-04-27 MED ORDER — ONDANSETRON HCL 4 MG/2ML IJ SOLN
4.0000 mg | Freq: Once | INTRAMUSCULAR | Status: DC | PRN
  Filled 2019-04-27: qty 2

## 2019-04-27 MED ORDER — LIDOCAINE HCL (CARDIAC) PF 100 MG/5ML IV SOSY
PREFILLED_SYRINGE | INTRAVENOUS | Status: DC | PRN
  Administered 2019-04-27: 60 mg via INTRAVENOUS

## 2019-04-27 MED ORDER — DEXAMETHASONE SODIUM PHOSPHATE 10 MG/ML IJ SOLN
INTRAMUSCULAR | Status: AC
  Filled 2019-04-27: qty 1

## 2019-04-27 MED ORDER — ACETAMINOPHEN 325 MG PO TABS
325.0000 mg | ORAL_TABLET | ORAL | Status: DC | PRN
  Filled 2019-04-27: qty 2

## 2019-04-27 MED ORDER — MIDAZOLAM HCL 5 MG/5ML IJ SOLN
INTRAMUSCULAR | Status: DC | PRN
  Administered 2019-04-27: 2 mg via INTRAVENOUS

## 2019-04-27 MED ORDER — CEFAZOLIN SODIUM-DEXTROSE 2-4 GM/100ML-% IV SOLN
INTRAVENOUS | Status: AC
  Filled 2019-04-27: qty 100

## 2019-04-27 MED ORDER — STERILE WATER FOR IRRIGATION IR SOLN
Status: DC | PRN
  Administered 2019-04-27: 3000 mL

## 2019-04-27 MED ORDER — FENTANYL CITRATE (PF) 100 MCG/2ML IJ SOLN
INTRAMUSCULAR | Status: DC | PRN
  Administered 2019-04-27: 50 ug via INTRAVENOUS

## 2019-04-27 MED ORDER — ONDANSETRON HCL 4 MG/2ML IJ SOLN
INTRAMUSCULAR | Status: AC
  Filled 2019-04-27: qty 2

## 2019-04-27 MED ORDER — OXYCODONE HCL 5 MG/5ML PO SOLN
5.0000 mg | Freq: Once | ORAL | Status: DC | PRN
  Filled 2019-04-27: qty 5

## 2019-04-27 MED ORDER — FENTANYL CITRATE (PF) 100 MCG/2ML IJ SOLN
25.0000 ug | INTRAMUSCULAR | Status: DC | PRN
  Filled 2019-04-27: qty 1

## 2019-04-27 MED ORDER — MIDAZOLAM HCL 2 MG/2ML IJ SOLN
INTRAMUSCULAR | Status: AC
  Filled 2019-04-27: qty 2

## 2019-04-27 MED ORDER — ACETAMINOPHEN 160 MG/5ML PO SOLN
325.0000 mg | ORAL | Status: DC | PRN
  Filled 2019-04-27: qty 20.3

## 2019-04-27 MED ORDER — FENTANYL CITRATE (PF) 100 MCG/2ML IJ SOLN
INTRAMUSCULAR | Status: AC
  Filled 2019-04-27: qty 2

## 2019-04-27 MED ORDER — ONDANSETRON HCL 4 MG/2ML IJ SOLN
INTRAMUSCULAR | Status: DC | PRN
  Administered 2019-04-27: 4 mg via INTRAVENOUS

## 2019-04-27 MED ORDER — PROPOFOL 10 MG/ML IV BOLUS
INTRAVENOUS | Status: AC
  Filled 2019-04-27: qty 40

## 2019-04-27 MED ORDER — ONABOTULINUMTOXINA 100 UNITS IJ SOLR
INTRAMUSCULAR | Status: AC
  Filled 2019-04-27: qty 200

## 2019-04-27 MED ORDER — CEFAZOLIN SODIUM-DEXTROSE 2-4 GM/100ML-% IV SOLN
2.0000 g | Freq: Once | INTRAVENOUS | Status: AC
  Administered 2019-04-27: 2 g via INTRAVENOUS
  Filled 2019-04-27: qty 100

## 2019-04-27 MED ORDER — SODIUM CHLORIDE (PF) 0.9 % IJ SOLN
INTRAMUSCULAR | Status: DC | PRN
  Administered 2019-04-27: 20 mL

## 2019-04-27 SURGICAL SUPPLY — 21 items
BAG DRAIN URO-CYSTO SKYTR STRL (DRAIN) ×3 IMPLANT
BAG DRN UROCATH (DRAIN) ×1
CATH ROBINSON RED A/P 14FR (CATHETERS) IMPLANT
CLOTH BEACON ORANGE TIMEOUT ST (SAFETY) ×3 IMPLANT
ELECT REM PT RETURN 9FT ADLT (ELECTROSURGICAL) ×3
ELECTRODE REM PT RTRN 9FT ADLT (ELECTROSURGICAL) ×1 IMPLANT
GLOVE BIO SURGEON STRL SZ7.5 (GLOVE) ×3 IMPLANT
GOWN STRL REUS W/ TWL XL LVL3 (GOWN DISPOSABLE) ×3 IMPLANT
GOWN STRL REUS W/TWL XL LVL3 (GOWN DISPOSABLE) ×6
KIT TURNOVER CYSTO (KITS) ×3 IMPLANT
MANIFOLD NEPTUNE II (INSTRUMENTS) ×3 IMPLANT
NDL ASPIRATION 22 (NEEDLE) IMPLANT
NDL HYPO 18GX1.5 BLUNT FILL (NEEDLE) IMPLANT
NEEDLE ASPIRATION 22 (NEEDLE) ×3 IMPLANT
NEEDLE HYPO 18GX1.5 BLUNT FILL (NEEDLE) IMPLANT
PACK CYSTO (CUSTOM PROCEDURE TRAY) ×3 IMPLANT
SYR 20ML LL LF (SYRINGE) ×2 IMPLANT
SYR CONTROL 10ML LL (SYRINGE) IMPLANT
TUBE CONNECTING 12'X1/4 (SUCTIONS) ×1
TUBE CONNECTING 12X1/4 (SUCTIONS) ×2 IMPLANT
WATER STERILE IRR 3000ML UROMA (IV SOLUTION) ×3 IMPLANT

## 2019-04-27 NOTE — Transfer of Care (Signed)
Immediate Anesthesia Transfer of Care Note  Patient: David Odom  Procedure(s) Performed: BOTOX INJECTION WITH CYSTOSCOPY 200 UNITS (N/A Bladder)  Patient Location: PACU  Anesthesia Type:MAC  Level of Consciousness: awake, alert  and oriented  Airway & Oxygen Therapy: Patient Spontanous Breathing and Patient connected to nasal cannula oxygen  Post-op Assessment: Report given to RN and Post -op Vital signs reviewed and stable  Post vital signs: Reviewed and stable  Last Vitals:  Vitals Value Taken Time  BP 124/78 04/27/19 0906  Temp 36.4 C 04/27/19 0905  Pulse 78 04/27/19 0909  Resp 15 04/27/19 0909  SpO2 94 % 04/27/19 0909  Vitals shown include unvalidated device data.  Last Pain:  Vitals:   04/27/19 0705  TempSrc: Oral      Patients Stated Pain Goal: 5 (36/12/24 4975)  Complications: No apparent anesthesia complications

## 2019-04-27 NOTE — Discharge Instructions (Signed)
CYSTOSCOPY HOME CARE INSTRUCTIONS  Activity: Rest for the remainder of the day.  Do not drive or operate equipment today.  You may resume normal activities in one to two days as instructed by your physician.   Meals: Drink plenty of liquids and eat light foods such as gelatin or soup this evening.  You may return to a normal meal plan tomorrow.  Return to Work: You may return to work in one to two days or as instructed by your physician.  Special Instructions / Symptoms: Call your physician if any of these symptoms occur:   -persistent or heavy bleeding  -bleeding which continues after first few urination  -large blood clots that are difficult to pass  -urine stream diminishes or stops completely  -fever equal to or higher than 101 degrees Farenheit.  -cloudy urine with a strong, foul odor  -severe pain  Females should always wipe from front to back after elimination.  You may feel some burning pain when you urinate.  This should disappear with time.  Applying moist heat to the lower abdomen or a hot tub bath may help relieve the pain. \  Follow-Up / Date of Return Visit to Your Physician:   Post Anesthesia Home Care Instructions  Activity: Get plenty of rest for the remainder of the day. A responsible individual must stay with you for 24 hours following the procedure.  For the next 24 hours, DO NOT: -Drive a car -Paediatric nurse -Drink alcoholic beverages -Take any medication unless instructed by your physician -Make any legal decisions or sign important papers.  Meals: Start with liquid foods such as gelatin or soup. Progress to regular foods as tolerated. Avoid greasy, spicy, heavy foods. If nausea and/or vomiting occur, drink only clear liquids until the nausea and/or vomiting subsides. Call your physician if vomiting continues.  Special Instructions/Symptoms: Your throat may feel dry or sore from the anesthesia or the breathing tube placed in your throat during surgery.  If this causes discomfort, gargle with warm salt water. The discomfort should disappear within 24 hours.  If you had a scopolamine patch placed behind your ear for the management of post- operative nausea and/or vomiting:  1. The medication in the patch is effective for 72 hours, after which it should be removed.  Wrap patch in a tissue and discard in the trash. Wash hands thoroughly with soap and water. 2. You may remove the patch earlier than 72 hours if you experience unpleasant side effects which may include dry mouth, dizziness or visual disturbances. 3. Avoid touching the patch. Wash your hands with soap and water after contact with the patch.    Call for an appointment to arrange follow-up.  Patient Signature:  ________________________________________________________  Nurse's Signature:  ________________________________________________________

## 2019-04-27 NOTE — OR Nursing (Signed)
While patient was prepping for surgery and wiping down with the CHG wipes he accidentally scratched himself to the RLE.  Open wound with slight bleeding noted.  Dressing applied prior to surgery.
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
## 2019-04-28 ENCOUNTER — Encounter (HOSPITAL_BASED_OUTPATIENT_CLINIC_OR_DEPARTMENT_OTHER): Payer: Self-pay | Admitting: Urology

## 2019-04-28 NOTE — Anesthesia Postprocedure Evaluation (Signed)
Anesthesia Post Note  Patient: David Odom  Procedure(s) Performed: BOTOX INJECTION WITH CYSTOSCOPY 200 UNITS (N/A Bladder)     Patient location during evaluation: PACU Anesthesia Type: General Level of consciousness: awake and alert Pain management: pain level controlled Vital Signs Assessment: post-procedure vital signs reviewed and stable Respiratory status: spontaneous breathing, nonlabored ventilation, respiratory function stable and patient connected to nasal cannula oxygen Cardiovascular status: blood pressure returned to baseline and stable Postop Assessment: no apparent nausea or vomiting Anesthetic complications: no    Last Vitals:  Vitals:   04/27/19 0945 04/27/19 1027  BP: 129/79 (!) 146/94  Pulse: 79 71  Resp: 16 14  Temp:  36.5 C  SpO2: 98% 100%    Last Pain:  Vitals:   04/28/19 1043  TempSrc:   PainSc: 0-No pain                 Minnah Llamas

## 2019-05-09 DIAGNOSIS — G8220 Paraplegia, unspecified: Secondary | ICD-10-CM | POA: Diagnosis not present

## 2019-05-09 DIAGNOSIS — Z23 Encounter for immunization: Secondary | ICD-10-CM | POA: Diagnosis not present

## 2019-05-09 DIAGNOSIS — E1140 Type 2 diabetes mellitus with diabetic neuropathy, unspecified: Secondary | ICD-10-CM | POA: Diagnosis not present

## 2019-05-09 DIAGNOSIS — Z1389 Encounter for screening for other disorder: Secondary | ICD-10-CM | POA: Diagnosis not present

## 2019-05-09 DIAGNOSIS — Z0000 Encounter for general adult medical examination without abnormal findings: Secondary | ICD-10-CM | POA: Diagnosis not present

## 2019-05-09 DIAGNOSIS — D500 Iron deficiency anemia secondary to blood loss (chronic): Secondary | ICD-10-CM | POA: Diagnosis not present

## 2019-05-09 DIAGNOSIS — N319 Neuromuscular dysfunction of bladder, unspecified: Secondary | ICD-10-CM | POA: Diagnosis not present

## 2019-05-09 DIAGNOSIS — G4733 Obstructive sleep apnea (adult) (pediatric): Secondary | ICD-10-CM | POA: Diagnosis not present

## 2019-07-18 DIAGNOSIS — R339 Retention of urine, unspecified: Secondary | ICD-10-CM | POA: Diagnosis not present

## 2019-07-18 DIAGNOSIS — N319 Neuromuscular dysfunction of bladder, unspecified: Secondary | ICD-10-CM | POA: Diagnosis not present

## 2019-08-09 DIAGNOSIS — R8279 Other abnormal findings on microbiological examination of urine: Secondary | ICD-10-CM | POA: Diagnosis not present

## 2019-08-09 DIAGNOSIS — N319 Neuromuscular dysfunction of bladder, unspecified: Secondary | ICD-10-CM | POA: Diagnosis not present

## 2019-09-06 DIAGNOSIS — N319 Neuromuscular dysfunction of bladder, unspecified: Secondary | ICD-10-CM | POA: Diagnosis not present

## 2019-09-06 DIAGNOSIS — N3020 Other chronic cystitis without hematuria: Secondary | ICD-10-CM | POA: Diagnosis not present

## 2019-10-21 DIAGNOSIS — N319 Neuromuscular dysfunction of bladder, unspecified: Secondary | ICD-10-CM | POA: Diagnosis not present

## 2019-10-21 DIAGNOSIS — R339 Retention of urine, unspecified: Secondary | ICD-10-CM | POA: Diagnosis not present

## 2019-10-31 DIAGNOSIS — N319 Neuromuscular dysfunction of bladder, unspecified: Secondary | ICD-10-CM | POA: Diagnosis not present

## 2019-10-31 DIAGNOSIS — N3020 Other chronic cystitis without hematuria: Secondary | ICD-10-CM | POA: Diagnosis not present

## 2019-11-07 ENCOUNTER — Other Ambulatory Visit: Payer: Self-pay | Admitting: Urology

## 2019-11-08 ENCOUNTER — Other Ambulatory Visit: Payer: Self-pay

## 2019-11-08 ENCOUNTER — Encounter (HOSPITAL_BASED_OUTPATIENT_CLINIC_OR_DEPARTMENT_OTHER): Payer: Self-pay | Admitting: Urology

## 2019-11-08 NOTE — Progress Notes (Signed)
Spoke w/ via phone for pre-op interview---patient Lab needs dos----   I stat 8, ekg            COVID test ------11-12-2019@940  am Arrive at -------1045 am 11-16-2019 NPO after ------midnight Medications to take morning of surgery -----methamine Diabetic medication -----n/a diet controlled dm Patient Special Instructions -----bring cpap mask tubing and machine and leave in car Pre-Op special Istructions ----- Patient verbalized understanding of instructions that were given at this phone interview. Patient denies shortness of breath, chest pain, fever, cough a this phone interview.  Patient is paraplegic and transers self independently without slide board.

## 2019-11-10 DIAGNOSIS — R972 Elevated prostate specific antigen [PSA]: Secondary | ICD-10-CM | POA: Diagnosis not present

## 2019-11-12 ENCOUNTER — Other Ambulatory Visit (HOSPITAL_COMMUNITY)
Admission: RE | Admit: 2019-11-12 | Discharge: 2019-11-12 | Disposition: A | Discharge status: Home / Self Care | Payer: Medicare HMO | Source: Ambulatory Visit | Attending: Urology | Admitting: Urology

## 2019-11-12 DIAGNOSIS — Z20822 Contact with and (suspected) exposure to covid-19: Secondary | ICD-10-CM | POA: Diagnosis not present

## 2019-11-12 DIAGNOSIS — Z01812 Encounter for preprocedural laboratory examination: Secondary | ICD-10-CM | POA: Insufficient documentation

## 2019-11-12 LAB — SARS CORONAVIRUS 2 (TAT 6-24 HRS): SARS Coronavirus 2: NEGATIVE

## 2019-11-16 ENCOUNTER — Ambulatory Visit (HOSPITAL_BASED_OUTPATIENT_CLINIC_OR_DEPARTMENT_OTHER): Payer: Medicare HMO | Admitting: Certified Registered"

## 2019-11-16 ENCOUNTER — Encounter (HOSPITAL_BASED_OUTPATIENT_CLINIC_OR_DEPARTMENT_OTHER)
Admission: RE | Disposition: A | Discharge status: Home / Self Care | Payer: Self-pay | Source: Home / Self Care | Attending: Urology

## 2019-11-16 ENCOUNTER — Encounter (HOSPITAL_BASED_OUTPATIENT_CLINIC_OR_DEPARTMENT_OTHER): Payer: Self-pay | Admitting: Urology

## 2019-11-16 ENCOUNTER — Ambulatory Visit (HOSPITAL_BASED_OUTPATIENT_CLINIC_OR_DEPARTMENT_OTHER)
Admission: RE | Admit: 2019-11-16 | Discharge: 2019-11-16 | Disposition: A | Discharge status: Home / Self Care | Payer: Medicare HMO | Attending: Urology | Admitting: Urology

## 2019-11-16 DIAGNOSIS — N3941 Urge incontinence: Secondary | ICD-10-CM | POA: Diagnosis not present

## 2019-11-16 DIAGNOSIS — D649 Anemia, unspecified: Secondary | ICD-10-CM | POA: Diagnosis not present

## 2019-11-16 DIAGNOSIS — N3289 Other specified disorders of bladder: Secondary | ICD-10-CM | POA: Insufficient documentation

## 2019-11-16 DIAGNOSIS — G8220 Paraplegia, unspecified: Secondary | ICD-10-CM | POA: Diagnosis not present

## 2019-11-16 DIAGNOSIS — S34101S Unspecified injury to L1 level of lumbar spinal cord, sequela: Secondary | ICD-10-CM | POA: Insufficient documentation

## 2019-11-16 DIAGNOSIS — E119 Type 2 diabetes mellitus without complications: Secondary | ICD-10-CM | POA: Insufficient documentation

## 2019-11-16 DIAGNOSIS — G4730 Sleep apnea, unspecified: Secondary | ICD-10-CM | POA: Diagnosis not present

## 2019-11-16 DIAGNOSIS — N319 Neuromuscular dysfunction of bladder, unspecified: Secondary | ICD-10-CM | POA: Insufficient documentation

## 2019-11-16 HISTORY — PX: BOTOX INJECTION: SHX5754

## 2019-11-16 LAB — GLUCOSE, CAPILLARY: Glucose-Capillary: 75 mg/dL (ref 70–99)

## 2019-11-16 SURGERY — BOTOX INJECTION
Anesthesia: General | Site: Bladder

## 2019-11-16 MED ORDER — FENTANYL CITRATE (PF) 100 MCG/2ML IJ SOLN
INTRAMUSCULAR | Status: DC | PRN
  Administered 2019-11-16 (×2): 25 ug via INTRAVENOUS
  Administered 2019-11-16: 50 ug via INTRAVENOUS

## 2019-11-16 MED ORDER — LIDOCAINE 2% (20 MG/ML) 5 ML SYRINGE
INTRAMUSCULAR | Status: AC
  Filled 2019-11-16: qty 5

## 2019-11-16 MED ORDER — PROPOFOL 10 MG/ML IV BOLUS
INTRAVENOUS | Status: AC
  Filled 2019-11-16: qty 20

## 2019-11-16 MED ORDER — PROPOFOL 10 MG/ML IV BOLUS
INTRAVENOUS | Status: DC | PRN
  Administered 2019-11-16: 120 mg via INTRAVENOUS

## 2019-11-16 MED ORDER — STERILE WATER FOR IRRIGATION IR SOLN
Status: DC | PRN
  Administered 2019-11-16: 3000 mL

## 2019-11-16 MED ORDER — MEPERIDINE HCL 25 MG/ML IJ SOLN: 6.2500 mg | INTRAMUSCULAR | Status: DC | PRN

## 2019-11-16 MED ORDER — CEFAZOLIN SODIUM-DEXTROSE 2-4 GM/100ML-% IV SOLN
2.0000 g | Freq: Once | INTRAVENOUS | Status: AC
  Administered 2019-11-16: 2 g via INTRAVENOUS

## 2019-11-16 MED ORDER — MIDAZOLAM HCL 2 MG/2ML IJ SOLN
INTRAMUSCULAR | Status: AC
  Filled 2019-11-16: qty 2

## 2019-11-16 MED ORDER — ONDANSETRON HCL 4 MG/2ML IJ SOLN
INTRAMUSCULAR | Status: DC | PRN
  Administered 2019-11-16: 4 mg via INTRAVENOUS

## 2019-11-16 MED ORDER — LIDOCAINE 2% (20 MG/ML) 5 ML SYRINGE
INTRAMUSCULAR | Status: DC | PRN
  Administered 2019-11-16: 80 mg via INTRAVENOUS

## 2019-11-16 MED ORDER — ONABOTULINUMTOXINA 100 UNITS IJ SOLR
INTRAMUSCULAR | Status: DC | PRN
  Administered 2019-11-16: 200 [IU] via INTRAMUSCULAR

## 2019-11-16 MED ORDER — ONDANSETRON HCL 4 MG/2ML IJ SOLN: 4.0000 mg | Freq: Once | INTRAMUSCULAR | Status: DC | PRN

## 2019-11-16 MED ORDER — FENTANYL CITRATE (PF) 100 MCG/2ML IJ SOLN
INTRAMUSCULAR | Status: AC
  Filled 2019-11-16: qty 2

## 2019-11-16 MED ORDER — DEXAMETHASONE SODIUM PHOSPHATE 10 MG/ML IJ SOLN
INTRAMUSCULAR | Status: DC | PRN
  Administered 2019-11-16: 10 mg via INTRAVENOUS

## 2019-11-16 MED ORDER — MIDAZOLAM HCL 5 MG/5ML IJ SOLN
INTRAMUSCULAR | Status: DC | PRN
  Administered 2019-11-16: 2 mg via INTRAVENOUS

## 2019-11-16 MED ORDER — ONDANSETRON HCL 4 MG/2ML IJ SOLN
INTRAMUSCULAR | Status: AC
  Filled 2019-11-16: qty 2

## 2019-11-16 MED ORDER — DEXAMETHASONE SODIUM PHOSPHATE 10 MG/ML IJ SOLN
INTRAMUSCULAR | Status: AC
  Filled 2019-11-16: qty 1

## 2019-11-16 MED ORDER — ONABOTULINUMTOXINA 100 UNITS IJ SOLR
INTRAMUSCULAR | Status: AC
  Filled 2019-11-16: qty 200

## 2019-11-16 MED ORDER — CEFAZOLIN SODIUM-DEXTROSE 2-4 GM/100ML-% IV SOLN
INTRAVENOUS | Status: AC
  Filled 2019-11-16: qty 100

## 2019-11-16 MED ORDER — LACTATED RINGERS IV SOLN: INTRAVENOUS | Status: DC

## 2019-11-16 MED ORDER — HYDROMORPHONE HCL 1 MG/ML IJ SOLN: 0.2500 mg | INTRAMUSCULAR | Status: DC | PRN

## 2019-11-16 SURGICAL SUPPLY — 21 items
BAG DRAIN URO-CYSTO SKYTR STRL (DRAIN) ×3 IMPLANT
BAG DRN UROCATH (DRAIN) ×1
CATH ROBINSON RED A/P 14FR (CATHETERS) IMPLANT
CLOTH BEACON ORANGE TIMEOUT ST (SAFETY) ×3 IMPLANT
ELECT REM PT RETURN 9FT ADLT (ELECTROSURGICAL)
ELECTRODE REM PT RTRN 9FT ADLT (ELECTROSURGICAL) ×1 IMPLANT
GLOVE BIO SURGEON STRL SZ7.5 (GLOVE) ×5 IMPLANT
GOWN STRL REUS W/ TWL XL LVL3 (GOWN DISPOSABLE) ×3 IMPLANT
GOWN STRL REUS W/TWL XL LVL3 (GOWN DISPOSABLE) ×6
KIT TURNOVER CYSTO (KITS) ×3 IMPLANT
MANIFOLD NEPTUNE II (INSTRUMENTS) ×3 IMPLANT
NDL ASPIRATION 22 (NEEDLE) IMPLANT
NDL SAFETY ECLIPSE 18X1.5 (NEEDLE) IMPLANT
NEEDLE ASPIRATION 22 (NEEDLE) ×3 IMPLANT
NEEDLE HYPO 18GX1.5 SHARP (NEEDLE)
SYR 20ML LL LF (SYRINGE) ×2 IMPLANT
SYR CONTROL 10ML LL (SYRINGE) IMPLANT
TRAY CYSTO PACK (CUSTOM PROCEDURE TRAY) ×3 IMPLANT
TUBE CONNECTING 12'X1/4 (SUCTIONS) ×1
TUBE CONNECTING 12X1/4 (SUCTIONS) ×2 IMPLANT
WATER STERILE IRR 3000ML UROMA (IV SOLUTION) ×3 IMPLANT

## 2019-11-16 NOTE — Anesthesia Procedure Notes (Signed)
Procedure Name: Intubation Date/Time: 11/16/2019 1:10 PM Performed by: Mikey Maffett D, CRNA Pre-anesthesia Checklist: Patient identified, Emergency Drugs available, Suction available and Patient being monitored Patient Re-evaluated:Patient Re-evaluated prior to induction Oxygen Delivery Method: Circle system utilized Preoxygenation: Pre-oxygenation with 100% oxygen Induction Type: IV induction Ventilation: Mask ventilation without difficulty LMA: LMA inserted LMA Size: 5.0 Tube type: Oral Number of attempts: 1 Placement Confirmation: positive ETCO2 and breath sounds checked- equal and bilateral Tube secured with: Tape Dental Injury: Teeth and Oropharynx as per pre-operative assessment

## 2019-11-16 NOTE — Discharge Instructions (Signed)
Botulinum Toxin Bladder Injection What can I expect after procedure? After your procedure, it is common to have:  Blood-tinged urine.  Burning or soreness when you pass urine. Follow these instructions at home: Medicines  Take over-the-counter and prescription medicines only as told by your health care provider.  If you were prescribed an antibiotic medicine, take it as told by your health care provider. Do not stop taking the antibiotic even if you start to feel better. General instructions   Do not drive for 24 hours if you were given a sedative during your procedure.  Drink enough fluid to keep your urine pale yellow.  Return to your normal activities as told by your health care provider. Ask your health care provider what activities are safe for you.  Keep all follow-up visits as told by your health care provider. This is important. Contact a health care provider if you have:  A fever or chills.  Blood-tinged urine for more than one day after your procedure.  Worsening pain or burning when you pass urine.  Pain or burning when passing urine for more than two days after your procedure.  Trouble emptying your bladder. Get help right away if you:  Have bright red blood in your urine.  Are unable to pass urine. Summary  A botulinum toxin bladder injection is a procedure to treat an overactive bladder.  This is generally a very safe procedure. However, problems may occur, including not being able to pass urine, bleeding, infection, pain, and allergic reactions to medicines.  You will be told when to stop eating and drinking, and what medicines to change or stop. Follow instructions carefully.  After the procedure, it is common to have blood in urine and to have soreness or burning when passing urine.  Contact a health care provider if you have a fever, have blood in urine for more than a few days, or have trouble passing urine. Get help right away if you have bright  red blood in the urine, or if you are unable to pass urine. This information is not intended to replace advice given to you by your health care provider. Make sure you discuss any questions you have with your health care provider. Document Revised: 12/11/2017 Document Reviewed: 12/11/2017 Elsevier Patient Education  2020 [employer].  Post Anesthesia Home Care Instructions  Activity: Get plenty of rest for the remainder of the day. A responsible individual must stay with you for 24 hours following the procedure.  For the next 24 hours, DO NOT: -Drive a car -Advertising copywriter -Drink alcoholic beverages -Take any medication unless instructed by your physician -Make any legal decisions or sign important papers.  Meals: Start with liquid foods such as gelatin or soup. Progress to regular foods as tolerated. Avoid greasy, spicy, heavy foods. If nausea and/or vomiting occur, drink only clear liquids until the nausea and/or vomiting subsides. Call your physician if vomiting continues.  Special Instructions/Symptoms: Your throat may feel dry or sore from the anesthesia or the breathing tube placed in your throat during surgery. If this causes discomfort, gargle with warm salt water. The discomfort should disappear within 24 hours.
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
## 2019-11-16 NOTE — Transfer of Care (Signed)
Immediate Anesthesia Transfer of Care Note  Patient: David Odom  Procedure(s) Performed: CYSTOSCOPY WITH BOTOX INJECTION 200 UNITS (N/A Bladder)  Patient Location: PACU  Anesthesia Type:General  Level of Consciousness: awake, alert  and oriented  Airway & Oxygen Therapy: Patient Spontanous Breathing and Patient connected to nasal cannula oxygen  Post-op Assessment: Report given to RN and Post -op Vital signs reviewed and stable  Post vital signs: Reviewed and stable  Last Vitals:  Vitals Value Taken Time  BP 137/79 11/16/19 1336  Temp    Pulse 86 11/16/19 1338  Resp 12 11/16/19 1338  SpO2 98 % 11/16/19 1338  Vitals shown include unvalidated device data.  Last Pain:  Vitals:   11/16/19 1110  TempSrc: Oral  PainSc: 0-No pain      Patients Stated Pain Goal: 7 (11/16/19 1110)  Complications: No apparent anesthesia complications

## 2019-11-16 NOTE — Anesthesia Postprocedure Evaluation (Signed)
Anesthesia Post Note  Patient: David Odom  Procedure(s) Performed: CYSTOSCOPY WITH BOTOX INJECTION 200 UNITS (N/A Bladder)     Patient location during evaluation: PACU Anesthesia Type: General Level of consciousness: awake and alert Pain management: pain level controlled Vital Signs Assessment: post-procedure vital signs reviewed and stable Respiratory status: spontaneous breathing, nonlabored ventilation, respiratory function stable and patient connected to nasal cannula oxygen Cardiovascular status: blood pressure returned to baseline and stable Postop Assessment: no apparent nausea or vomiting Anesthetic complications: no    Last Vitals:  Vitals:   11/16/19 1345 11/16/19 1400  BP: 131/84 136/79  Pulse: 84 84  Resp: 14 13  Temp:    SpO2: 97% 99%    Last Pain:  Vitals:   11/16/19 1337  TempSrc:   PainSc: 0-No pain                 [name] DAVID
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
## 2019-12-02 DIAGNOSIS — N3941 Urge incontinence: Secondary | ICD-10-CM | POA: Diagnosis not present

## 2019-12-02 DIAGNOSIS — N319 Neuromuscular dysfunction of bladder, unspecified: Secondary | ICD-10-CM | POA: Diagnosis not present

## 2020-02-09 DIAGNOSIS — R339 Retention of urine, unspecified: Secondary | ICD-10-CM | POA: Diagnosis not present

## 2020-02-09 DIAGNOSIS — N319 Neuromuscular dysfunction of bladder, unspecified: Secondary | ICD-10-CM | POA: Diagnosis not present

## 2020-02-27 DIAGNOSIS — N390 Urinary tract infection, site not specified: Secondary | ICD-10-CM | POA: Diagnosis not present

## 2020-05-04 ENCOUNTER — Other Ambulatory Visit: Payer: Self-pay | Admitting: Urology

## 2020-05-04 DIAGNOSIS — N3281 Overactive bladder: Secondary | ICD-10-CM | POA: Diagnosis not present

## 2020-05-04 DIAGNOSIS — N319 Neuromuscular dysfunction of bladder, unspecified: Secondary | ICD-10-CM | POA: Diagnosis not present

## 2020-05-07 ENCOUNTER — Other Ambulatory Visit: Payer: Self-pay

## 2020-05-07 ENCOUNTER — Encounter (HOSPITAL_BASED_OUTPATIENT_CLINIC_OR_DEPARTMENT_OTHER): Payer: Self-pay | Admitting: Urology

## 2020-05-07 NOTE — Progress Notes (Addendum)
Spoke w/ via phone for pre-op interview---pt Lab needs dos----   I stat  8            Lab results------11-16-2019 ekg COVID test ------05-12-2020 920 am Arrive at -------700 am 05-16-2020 NPO after MN NO Solid Food.  Clear liquids from MN until---600 am then npo Medications to take morning of surgery ----- methamine Diabetic medication -----none diet controlled  dm Patient Special Instructions -----bring cpap, mask machine and tubing and leave in car Pre-Op special Istructions -----none Patient verbalized understanding of instructions that were given at this phone interview. Patient denies shortness of breath, chest pain, fever, cough at this phone interview.

## 2020-05-08 DIAGNOSIS — N319 Neuromuscular dysfunction of bladder, unspecified: Secondary | ICD-10-CM | POA: Diagnosis not present

## 2020-05-08 DIAGNOSIS — R339 Retention of urine, unspecified: Secondary | ICD-10-CM | POA: Diagnosis not present

## 2020-05-12 ENCOUNTER — Other Ambulatory Visit (HOSPITAL_COMMUNITY)
Admission: RE | Admit: 2020-05-12 | Discharge: 2020-05-12 | Disposition: A | Discharge status: Home / Self Care | Payer: Medicare HMO | Source: Ambulatory Visit | Attending: Urology | Admitting: Urology

## 2020-05-12 DIAGNOSIS — Z01812 Encounter for preprocedural laboratory examination: Secondary | ICD-10-CM | POA: Diagnosis not present

## 2020-05-12 DIAGNOSIS — Z20822 Contact with and (suspected) exposure to covid-19: Secondary | ICD-10-CM | POA: Diagnosis not present

## 2020-05-13 LAB — SARS CORONAVIRUS 2 (TAT 6-24 HRS): SARS Coronavirus 2: NEGATIVE

## 2020-05-15 DIAGNOSIS — E1140 Type 2 diabetes mellitus with diabetic neuropathy, unspecified: Secondary | ICD-10-CM | POA: Diagnosis not present

## 2020-05-15 DIAGNOSIS — G8220 Paraplegia, unspecified: Secondary | ICD-10-CM | POA: Diagnosis not present

## 2020-05-15 DIAGNOSIS — G4733 Obstructive sleep apnea (adult) (pediatric): Secondary | ICD-10-CM | POA: Diagnosis not present

## 2020-05-15 DIAGNOSIS — M25559 Pain in unspecified hip: Secondary | ICD-10-CM | POA: Diagnosis not present

## 2020-05-15 DIAGNOSIS — N319 Neuromuscular dysfunction of bladder, unspecified: Secondary | ICD-10-CM | POA: Diagnosis not present

## 2020-05-15 DIAGNOSIS — Z0000 Encounter for general adult medical examination without abnormal findings: Secondary | ICD-10-CM | POA: Diagnosis not present

## 2020-05-15 DIAGNOSIS — Z23 Encounter for immunization: Secondary | ICD-10-CM | POA: Diagnosis not present

## 2020-05-15 DIAGNOSIS — E119 Type 2 diabetes mellitus without complications: Secondary | ICD-10-CM | POA: Diagnosis not present

## 2020-05-15 DIAGNOSIS — Z1389 Encounter for screening for other disorder: Secondary | ICD-10-CM | POA: Diagnosis not present

## 2020-05-16 ENCOUNTER — Ambulatory Visit (HOSPITAL_BASED_OUTPATIENT_CLINIC_OR_DEPARTMENT_OTHER): Payer: Medicare HMO | Admitting: Anesthesiology

## 2020-05-16 ENCOUNTER — Ambulatory Visit (HOSPITAL_BASED_OUTPATIENT_CLINIC_OR_DEPARTMENT_OTHER)
Admission: RE | Admit: 2020-05-16 | Discharge: 2020-05-16 | Disposition: A | Discharge status: Home / Self Care | Payer: Medicare HMO | Attending: Urology | Admitting: Urology

## 2020-05-16 ENCOUNTER — Encounter (HOSPITAL_BASED_OUTPATIENT_CLINIC_OR_DEPARTMENT_OTHER): Payer: Self-pay | Admitting: Urology

## 2020-05-16 ENCOUNTER — Encounter (HOSPITAL_BASED_OUTPATIENT_CLINIC_OR_DEPARTMENT_OTHER)
Admission: RE | Disposition: A | Discharge status: Home / Self Care | Payer: Self-pay | Source: Home / Self Care | Attending: Urology

## 2020-05-16 DIAGNOSIS — T148XXS Other injury of unspecified body region, sequela: Secondary | ICD-10-CM | POA: Diagnosis not present

## 2020-05-16 DIAGNOSIS — Z86718 Personal history of other venous thrombosis and embolism: Secondary | ICD-10-CM | POA: Insufficient documentation

## 2020-05-16 DIAGNOSIS — N3941 Urge incontinence: Secondary | ICD-10-CM | POA: Insufficient documentation

## 2020-05-16 DIAGNOSIS — G9589 Other specified diseases of spinal cord: Secondary | ICD-10-CM | POA: Diagnosis present

## 2020-05-16 DIAGNOSIS — Z87440 Personal history of urinary (tract) infections: Secondary | ICD-10-CM | POA: Diagnosis not present

## 2020-05-16 DIAGNOSIS — E119 Type 2 diabetes mellitus without complications: Secondary | ICD-10-CM | POA: Diagnosis not present

## 2020-05-16 DIAGNOSIS — N318 Other neuromuscular dysfunction of bladder: Secondary | ICD-10-CM | POA: Diagnosis not present

## 2020-05-16 DIAGNOSIS — G8220 Paraplegia, unspecified: Secondary | ICD-10-CM | POA: Insufficient documentation

## 2020-05-16 DIAGNOSIS — G4730 Sleep apnea, unspecified: Secondary | ICD-10-CM | POA: Diagnosis not present

## 2020-05-16 DIAGNOSIS — Z9989 Dependence on other enabling machines and devices: Secondary | ICD-10-CM | POA: Diagnosis not present

## 2020-05-16 DIAGNOSIS — S34101S Unspecified injury to L1 level of lumbar spinal cord, sequela: Secondary | ICD-10-CM | POA: Diagnosis not present

## 2020-05-16 DIAGNOSIS — N3281 Overactive bladder: Secondary | ICD-10-CM | POA: Insufficient documentation

## 2020-05-16 DIAGNOSIS — N319 Neuromuscular dysfunction of bladder, unspecified: Secondary | ICD-10-CM | POA: Diagnosis not present

## 2020-05-16 HISTORY — PX: BOTOX INJECTION: SHX5754

## 2020-05-16 LAB — GLUCOSE, CAPILLARY: Glucose-Capillary: 97 mg/dL (ref 70–99)

## 2020-05-16 LAB — POCT I-STAT, CHEM 8
BUN: 19 mg/dL (ref 6–20)
Calcium, Ion: 1.23 mmol/L (ref 1.15–1.40)
Chloride: 104 mmol/L (ref 98–111)
Creatinine, Ser: 0.8 mg/dL (ref 0.61–1.24)
Glucose, Bld: 87 mg/dL (ref 70–99)
HCT: 49 % (ref 39.0–52.0)
Hemoglobin: 16.7 g/dL (ref 13.0–17.0)
Potassium: 3.8 mmol/L (ref 3.5–5.1)
Sodium: 142 mmol/L (ref 135–145)
TCO2: 26 mmol/L (ref 22–32)

## 2020-05-16 SURGERY — BOTOX INJECTION
Anesthesia: General | Site: Bladder

## 2020-05-16 MED ORDER — AMISULPRIDE (ANTIEMETIC) 5 MG/2ML IV SOLN: 10.0000 mg | Freq: Once | INTRAVENOUS | Status: DC | PRN

## 2020-05-16 MED ORDER — LIDOCAINE HCL (PF) 2 % IJ SOLN
INTRAMUSCULAR | Status: AC
  Filled 2020-05-16: qty 5

## 2020-05-16 MED ORDER — PROPOFOL 10 MG/ML IV BOLUS
INTRAVENOUS | Status: DC | PRN
  Administered 2020-05-16: 170 mg via INTRAVENOUS

## 2020-05-16 MED ORDER — CEFAZOLIN SODIUM-DEXTROSE 2-4 GM/100ML-% IV SOLN
INTRAVENOUS | Status: AC
  Filled 2020-05-16: qty 100

## 2020-05-16 MED ORDER — PROPOFOL 10 MG/ML IV BOLUS
INTRAVENOUS | Status: AC
  Filled 2020-05-16: qty 20

## 2020-05-16 MED ORDER — OXYCODONE HCL 5 MG/5ML PO SOLN: 5.0000 mg | Freq: Once | ORAL | Status: DC | PRN

## 2020-05-16 MED ORDER — FENTANYL CITRATE (PF) 100 MCG/2ML IJ SOLN
INTRAMUSCULAR | Status: DC | PRN
  Administered 2020-05-16: 50 ug via INTRAVENOUS

## 2020-05-16 MED ORDER — DEXAMETHASONE SODIUM PHOSPHATE 10 MG/ML IJ SOLN
INTRAMUSCULAR | Status: DC | PRN
  Administered 2020-05-16: 5 mg via INTRAVENOUS

## 2020-05-16 MED ORDER — CEPHALEXIN 500 MG PO CAPS: 500.0000 mg | ORAL_CAPSULE | Freq: Two times a day (BID) | ORAL | 0 refills | Status: AC

## 2020-05-16 MED ORDER — FENTANYL CITRATE (PF) 100 MCG/2ML IJ SOLN: 25.0000 ug | INTRAMUSCULAR | Status: DC | PRN

## 2020-05-16 MED ORDER — ONDANSETRON HCL 4 MG/2ML IJ SOLN
INTRAMUSCULAR | Status: AC
  Filled 2020-05-16: qty 2

## 2020-05-16 MED ORDER — DEXAMETHASONE SODIUM PHOSPHATE 10 MG/ML IJ SOLN
INTRAMUSCULAR | Status: AC
  Filled 2020-05-16: qty 1

## 2020-05-16 MED ORDER — LACTATED RINGERS IV SOLN: INTRAVENOUS | Status: DC

## 2020-05-16 MED ORDER — SODIUM CHLORIDE (PF) 0.9 % IJ SOLN
INTRAMUSCULAR | Status: DC | PRN
  Administered 2020-05-16: 30 mL

## 2020-05-16 MED ORDER — CEFAZOLIN SODIUM-DEXTROSE 2-4 GM/100ML-% IV SOLN
2.0000 g | Freq: Once | INTRAVENOUS | Status: AC
  Administered 2020-05-16: 2 g via INTRAVENOUS

## 2020-05-16 MED ORDER — ONDANSETRON HCL 4 MG/2ML IJ SOLN: 4.0000 mg | Freq: Once | INTRAMUSCULAR | Status: DC | PRN

## 2020-05-16 MED ORDER — ONDANSETRON HCL 4 MG/2ML IJ SOLN
INTRAMUSCULAR | Status: DC | PRN
  Administered 2020-05-16: 4 mg via INTRAVENOUS

## 2020-05-16 MED ORDER — OXYCODONE HCL 5 MG PO TABS: 5.0000 mg | ORAL_TABLET | Freq: Once | ORAL | Status: DC | PRN

## 2020-05-16 MED ORDER — FENTANYL CITRATE (PF) 100 MCG/2ML IJ SOLN
INTRAMUSCULAR | Status: AC
  Filled 2020-05-16: qty 2

## 2020-05-16 MED ORDER — LIDOCAINE 2% (20 MG/ML) 5 ML SYRINGE
INTRAMUSCULAR | Status: DC | PRN
  Administered 2020-05-16: 80 mg via INTRAVENOUS

## 2020-05-16 MED ORDER — ONABOTULINUMTOXINA 100 UNITS IJ SOLR
INTRAMUSCULAR | Status: DC | PRN
  Administered 2020-05-16: 300 [IU] via INTRAMUSCULAR

## 2020-05-16 MED ORDER — STERILE WATER FOR IRRIGATION IR SOLN
Status: DC | PRN
  Administered 2020-05-16: 3000 mL

## 2020-05-16 SURGICAL SUPPLY — 22 items
BAG DRAIN URO-CYSTO SKYTR STRL (DRAIN) ×3 IMPLANT
BAG DRN UROCATH (DRAIN) ×1
CATH ROBINSON RED A/P 14FR (CATHETERS) IMPLANT
CLOTH BEACON ORANGE TIMEOUT ST (SAFETY) ×3 IMPLANT
ELECT REM PT RETURN 9FT ADLT (ELECTROSURGICAL)
ELECTRODE REM PT RTRN 9FT ADLT (ELECTROSURGICAL) ×1 IMPLANT
GLOVE BIO SURGEON STRL SZ7.5 (GLOVE) ×3 IMPLANT
GOWN STRL REUS W/ TWL XL LVL3 (GOWN DISPOSABLE) ×3 IMPLANT
GOWN STRL REUS W/TWL LRG LVL3 (GOWN DISPOSABLE) ×2 IMPLANT
GOWN STRL REUS W/TWL XL LVL3 (GOWN DISPOSABLE) ×3
KIT TURNOVER CYSTO (KITS) ×3 IMPLANT
MANIFOLD NEPTUNE II (INSTRUMENTS) ×3 IMPLANT
NDL ASPIRATION 22 (NEEDLE) IMPLANT
NDL SAFETY ECLIPSE 18X1.5 (NEEDLE) IMPLANT
NEEDLE ASPIRATION 22 (NEEDLE) ×3 IMPLANT
NEEDLE HYPO 18GX1.5 SHARP (NEEDLE)
PACK CYSTO (CUSTOM PROCEDURE TRAY) ×3 IMPLANT
SYR 20ML LL LF (SYRINGE) IMPLANT
SYR CONTROL 10ML LL (SYRINGE) IMPLANT
TUBE CONNECTING 12'X1/4 (SUCTIONS) ×1
TUBE CONNECTING 12X1/4 (SUCTIONS) ×2 IMPLANT
WATER STERILE IRR 3000ML UROMA (IV SOLUTION) ×3 IMPLANT

## 2020-05-16 NOTE — Anesthesia Preprocedure Evaluation (Addendum)
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 negative psych ROS   GI/Hepatic negative GI ROS, Neg liver ROS,   Endo/Other  diabetes, Type 2  Renal/GU negative Renal ROS Bladder dysfunction (neurogenic bladder)      Musculoskeletal negative musculoskeletal ROS (+)   Abdominal   Peds  Hematology negative hematology ROS (+)   Anesthesia Other Findings   Reproductive/Obstetrics                            Anesthesia Physical Anesthesia Plan  ASA: III  Anesthesia Plan: General   Post-op Pain Management:    Induction: Intravenous  PONV Risk Score and Plan: 2 and Ondansetron, Dexamethasone, Midazolam and Treatment may vary due to age or medical condition  Airway Management Planned: LMA  Additional Equipment: None  Intra-op Plan:   Post-operative Plan: Extubation in OR  Informed Consent: I have reviewed the patients History and Physical, chart, labs and discussed the procedure including the risks, benefits and alternatives for the proposed anesthesia with the patient or authorized representative who has indicated his/her understanding and acceptance.     Dental advisory given  Plan Discussed with:   Anesthesia Plan Comments:        Anesthesia Quick Evaluation

## 2020-05-16 NOTE — Anesthesia Procedure Notes (Signed)
Procedure Name: LMA Insertion Date/Time: 05/16/2020 9:05 AM Performed by: Bishop Limbo, CRNA Pre-anesthesia Checklist: Patient identified, Emergency Drugs available, Suction available and Patient being monitored Patient Re-evaluated:Patient Re-evaluated prior to induction Oxygen Delivery Method: Circle System Utilized Preoxygenation: Pre-oxygenation with 100% oxygen Induction Type: IV induction Ventilation: Mask ventilation without difficulty LMA: LMA inserted LMA Size: 5.0 Number of attempts: 1 Placement Confirmation: positive ETCO2 Tube secured with: Tape Dental Injury: Teeth and Oropharynx as per pre-operative assessment

## 2020-05-16 NOTE — Anesthesia Postprocedure Evaluation (Signed)
Anesthesia Post Note  Patient: David Odom  Procedure(s) Performed: BOTOX INJECTION WITH CYSTOSCOPY (N/A Bladder)     Patient location during evaluation: PACU Anesthesia Type: General Level of consciousness: awake and alert Pain management: pain level controlled Vital Signs Assessment: post-procedure vital signs reviewed and stable Respiratory status: spontaneous breathing, nonlabored ventilation and respiratory function stable Cardiovascular status: blood pressure returned to baseline and stable Postop Assessment: no apparent nausea or vomiting Anesthetic complications: no   No complications documented.  Last Vitals:  Vitals:   05/16/20 1015 05/16/20 1100  BP: 129/75 118/83  Pulse: 77 77  Resp: 10 16  Temp: (!) 36.4 C 36.6 C  SpO2: 100% 99%    Last Pain:  Vitals:   05/16/20 1100  TempSrc:   PainSc: 0-No pain                 Lucretia Kern
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
## 2020-05-16 NOTE — Transfer of Care (Signed)
Immediate Anesthesia Transfer of Care Note  Patient: David Odom  Procedure(s) Performed: BOTOX INJECTION WITH CYSTOSCOPY (N/A Bladder)  Patient Location: PACU  Anesthesia Type:General  Level of Consciousness: awake, alert , oriented and patient cooperative  Airway & Oxygen Therapy: Patient Spontanous Breathing  Post-op Assessment: Report given to RN and Post -op Vital signs reviewed and stable  Post vital signs: Reviewed and stable  Last Vitals:  Vitals Value Taken Time  BP 138/80 05/16/20 0940  Temp    Pulse 82 05/16/20 0942  Resp 8 05/16/20 0942  SpO2 99 % 05/16/20 0942  Vitals shown include unvalidated device data.  Last Pain:  Vitals:   05/16/20 0717  TempSrc: Oral  PainSc: 0-No pain      Patients Stated Pain Goal: 5 (05/16/20 0717)  Complications: No complications documented.
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
## 2020-05-16 NOTE — Discharge Instructions (Signed)
  Post Anesthesia Home Care Instructions  Activity: Get plenty of rest for the remainder of the day. A responsible individual must stay with you for 24 hours following the procedure.  For the next 24 hours, DO NOT: -Drive a car -Operate machinery -Drink alcoholic beverages -Take any medication unless instructed by your physician -Make any legal decisions or sign important papers.  Meals: Start with liquid foods such as gelatin or soup. Progress to regular foods as tolerated. Avoid greasy, spicy, heavy foods. If nausea and/or vomiting occur, drink only clear liquids until the nausea and/or vomiting subsides. Call your physician if vomiting continues.  Special Instructions/Symptoms: Your throat may feel dry or sore from the anesthesia or the breathing tube placed in your throat during surgery. If this causes discomfort, gargle with warm salt water. The discomfort should disappear within 24 hours.  CYSTOSCOPY HOME CARE INSTRUCTIONS  Activity: Rest for the remainder of the day.  Do not drive or operate equipment today.  You may resume normal activities in one to two days as instructed by your physician.   Meals: Drink plenty of liquids and eat light foods such as gelatin or soup this evening.  You may return to a normal meal plan tomorrow.  Return to Work: You may return to work in one to two days or as instructed by your physician.  Special Instructions / Symptoms: Call your physician if any of these symptoms occur:   -persistent or heavy bleeding  -bleeding which continues after first few urination  -large blood clots that are difficult to pass  -urine stream diminishes or stops completely  -fever equal to or higher than 101 degrees Farenheit.  -cloudy urine with a strong, foul odor  -severe pain  Females should always wipe from front to back after elimination.  You may feel some burning pain when you urinate.  This should disappear with time.  Applying moist heat to the lower  abdomen or a hot tub bath may help relieve the pain.   

## 2020-05-17 ENCOUNTER — Encounter (HOSPITAL_BASED_OUTPATIENT_CLINIC_OR_DEPARTMENT_OTHER): Payer: Self-pay | Admitting: Urology

## 2020-08-01 DIAGNOSIS — R339 Retention of urine, unspecified: Secondary | ICD-10-CM | POA: Diagnosis not present

## 2020-08-01 DIAGNOSIS — N319 Neuromuscular dysfunction of bladder, unspecified: Secondary | ICD-10-CM | POA: Diagnosis not present

## 2020-08-06 DIAGNOSIS — N3020 Other chronic cystitis without hematuria: Secondary | ICD-10-CM | POA: Diagnosis not present

## 2020-10-15 DIAGNOSIS — N3020 Other chronic cystitis without hematuria: Secondary | ICD-10-CM | POA: Diagnosis not present

## 2020-10-15 DIAGNOSIS — N319 Neuromuscular dysfunction of bladder, unspecified: Secondary | ICD-10-CM | POA: Diagnosis not present

## 2020-10-23 ENCOUNTER — Other Ambulatory Visit: Payer: Self-pay | Admitting: Urology

## 2020-10-25 DIAGNOSIS — R8279 Other abnormal findings on microbiological examination of urine: Secondary | ICD-10-CM | POA: Diagnosis not present

## 2020-10-30 ENCOUNTER — Encounter (HOSPITAL_BASED_OUTPATIENT_CLINIC_OR_DEPARTMENT_OTHER): Payer: Self-pay | Admitting: Urology

## 2020-10-30 ENCOUNTER — Other Ambulatory Visit: Payer: Self-pay

## 2020-10-30 NOTE — Progress Notes (Signed)
Spoke w/ via phone for pre-op interview---pt Lab needs dos----   none            Lab results------ekg 11-16-2019 epic COVID test -----patient states asymptomatic no test needed Arrive at -------1300 11-02-2020 NPO after MN NO Solid Food.  Clear liquids from MN until---1200 pm then npo Med rec completed Medications to take morning of surgery -----methamineDiabetic medication ----- Patient instructed to bring photo id and insurance card day of surgery Patient aware to have Driver (ride ) / caregiver  Father bill will drop pt off   for 24 hours after surgery  Patient Special Instructions -----none Pre-Op special Istructions -----none Patient verbalized understanding of instructions that were given at this phone interview. Patient denies shortness of breath, chest pain, fever, cough at this phone interview.  Patient paraplegic in wheel chair transfers self independent

## 2020-10-31 DIAGNOSIS — N319 Neuromuscular dysfunction of bladder, unspecified: Secondary | ICD-10-CM | POA: Diagnosis not present

## 2020-10-31 DIAGNOSIS — R339 Retention of urine, unspecified: Secondary | ICD-10-CM | POA: Diagnosis not present

## 2020-11-02 ENCOUNTER — Encounter (HOSPITAL_BASED_OUTPATIENT_CLINIC_OR_DEPARTMENT_OTHER): Payer: Self-pay | Admitting: Urology

## 2020-11-02 ENCOUNTER — Ambulatory Visit (HOSPITAL_BASED_OUTPATIENT_CLINIC_OR_DEPARTMENT_OTHER)
Admission: RE | Admit: 2020-11-02 | Discharge: 2020-11-02 | Disposition: A | Discharge status: Home / Self Care | Payer: Medicare HMO | Source: Ambulatory Visit | Attending: Urology | Admitting: Urology

## 2020-11-02 ENCOUNTER — Other Ambulatory Visit: Payer: Self-pay

## 2020-11-02 ENCOUNTER — Ambulatory Visit (HOSPITAL_BASED_OUTPATIENT_CLINIC_OR_DEPARTMENT_OTHER): Payer: Medicare HMO | Admitting: Anesthesiology

## 2020-11-02 ENCOUNTER — Encounter (HOSPITAL_BASED_OUTPATIENT_CLINIC_OR_DEPARTMENT_OTHER)
Admission: RE | Disposition: A | Discharge status: Home / Self Care | Payer: Self-pay | Source: Ambulatory Visit | Attending: Urology

## 2020-11-02 DIAGNOSIS — S34101S Unspecified injury to L1 level of lumbar spinal cord, sequela: Secondary | ICD-10-CM | POA: Insufficient documentation

## 2020-11-02 DIAGNOSIS — N3941 Urge incontinence: Secondary | ICD-10-CM | POA: Insufficient documentation

## 2020-11-02 DIAGNOSIS — N3020 Other chronic cystitis without hematuria: Secondary | ICD-10-CM | POA: Insufficient documentation

## 2020-11-02 DIAGNOSIS — Z86718 Personal history of other venous thrombosis and embolism: Secondary | ICD-10-CM | POA: Diagnosis not present

## 2020-11-02 DIAGNOSIS — G8220 Paraplegia, unspecified: Secondary | ICD-10-CM | POA: Diagnosis not present

## 2020-11-02 DIAGNOSIS — X58XXXS Exposure to other specified factors, sequela: Secondary | ICD-10-CM | POA: Diagnosis not present

## 2020-11-02 DIAGNOSIS — N319 Neuromuscular dysfunction of bladder, unspecified: Secondary | ICD-10-CM | POA: Diagnosis not present

## 2020-11-02 DIAGNOSIS — Z7984 Long term (current) use of oral hypoglycemic drugs: Secondary | ICD-10-CM | POA: Insufficient documentation

## 2020-11-02 DIAGNOSIS — N3281 Overactive bladder: Secondary | ICD-10-CM | POA: Insufficient documentation

## 2020-11-02 DIAGNOSIS — Z9989 Dependence on other enabling machines and devices: Secondary | ICD-10-CM | POA: Diagnosis not present

## 2020-11-02 DIAGNOSIS — G4730 Sleep apnea, unspecified: Secondary | ICD-10-CM | POA: Diagnosis not present

## 2020-11-02 HISTORY — PX: BOTOX INJECTION: SHX5754

## 2020-11-02 LAB — GLUCOSE, CAPILLARY: Glucose-Capillary: 84 mg/dL (ref 70–99)

## 2020-11-02 SURGERY — BOTOX INJECTION
Anesthesia: Monitor Anesthesia Care | Site: Bladder

## 2020-11-02 MED ORDER — DEXAMETHASONE SODIUM PHOSPHATE 10 MG/ML IJ SOLN
INTRAMUSCULAR | Status: AC
  Filled 2020-11-02: qty 1

## 2020-11-02 MED ORDER — LIDOCAINE 2% (20 MG/ML) 5 ML SYRINGE
INTRAMUSCULAR | Status: AC
  Filled 2020-11-02: qty 5

## 2020-11-02 MED ORDER — LACTATED RINGERS IV SOLN: INTRAVENOUS | Status: DC

## 2020-11-02 MED ORDER — PIPERACILLIN-TAZOBACTAM 3.375 G IVPB 30 MIN
3.3750 g | Freq: Once | INTRAVENOUS | Status: AC
  Administered 2020-11-02: 3.375 g via INTRAVENOUS
  Filled 2020-11-02 (×2): qty 50

## 2020-11-02 MED ORDER — FENTANYL CITRATE (PF) 250 MCG/5ML IJ SOLN
INTRAMUSCULAR | Status: DC | PRN
  Administered 2020-11-02: 50 ug via INTRAVENOUS

## 2020-11-02 MED ORDER — LIDOCAINE 2% (20 MG/ML) 5 ML SYRINGE
INTRAMUSCULAR | Status: DC | PRN
  Administered 2020-11-02: 60 mg via INTRAVENOUS

## 2020-11-02 MED ORDER — AMOXICILLIN-POT CLAVULANATE 875-125 MG PO TABS: 1.0000 | ORAL_TABLET | Freq: Two times a day (BID) | ORAL | 0 refills | Status: AC

## 2020-11-02 MED ORDER — PROPOFOL 500 MG/50ML IV EMUL
INTRAVENOUS | Status: DC | PRN
  Administered 2020-11-02: 100 ug/kg/min via INTRAVENOUS

## 2020-11-02 MED ORDER — PROPOFOL 10 MG/ML IV BOLUS
INTRAVENOUS | Status: DC | PRN
  Administered 2020-11-02: 40 mg via INTRAVENOUS

## 2020-11-02 MED ORDER — ONABOTULINUMTOXINA 100 UNITS IJ SOLR
INTRAMUSCULAR | Status: DC | PRN
  Administered 2020-11-02: 300 [IU] via INTRAMUSCULAR

## 2020-11-02 MED ORDER — SODIUM CHLORIDE (PF) 0.9 % IJ SOLN
INTRAMUSCULAR | Status: DC | PRN
  Administered 2020-11-02: 30 mL

## 2020-11-02 MED ORDER — ONDANSETRON HCL 4 MG/2ML IJ SOLN
INTRAMUSCULAR | Status: AC
  Filled 2020-11-02: qty 2

## 2020-11-02 MED ORDER — FENTANYL CITRATE (PF) 100 MCG/2ML IJ SOLN
INTRAMUSCULAR | Status: AC
  Filled 2020-11-02: qty 2

## 2020-11-02 MED ORDER — STERILE WATER FOR IRRIGATION IR SOLN
Status: DC | PRN
  Administered 2020-11-02: 3000 mL

## 2020-11-02 SURGICAL SUPPLY — 21 items
BAG DRAIN URO-CYSTO SKYTR STRL (DRAIN) ×3 IMPLANT
BAG DRN UROCATH (DRAIN) ×1
CATH ROBINSON RED A/P 14FR (CATHETERS) IMPLANT
CLOTH BEACON ORANGE TIMEOUT ST (SAFETY) ×3 IMPLANT
DECANTER SPIKE VIAL GLASS SM (MISCELLANEOUS) ×2 IMPLANT
ELECT REM PT RETURN 9FT ADLT (ELECTROSURGICAL) ×3
ELECTRODE REM PT RTRN 9FT ADLT (ELECTROSURGICAL) ×1 IMPLANT
GLOVE SURG ENC MOIS LTX SZ7.5 (GLOVE) ×3 IMPLANT
GOWN STRL REUS W/TWL LRG LVL3 (GOWN DISPOSABLE) ×9 IMPLANT
KIT TURNOVER CYSTO (KITS) ×3 IMPLANT
MANIFOLD NEPTUNE II (INSTRUMENTS) ×3 IMPLANT
NDL ASPIRATION 22 (NEEDLE) IMPLANT
NDL SAFETY ECLIPSE 18X1.5 (NEEDLE) IMPLANT
NEEDLE ASPIRATION 22 (NEEDLE) IMPLANT
NEEDLE HYPO 18GX1.5 SHARP (NEEDLE) ×6
PACK CYSTO (CUSTOM PROCEDURE TRAY) ×3 IMPLANT
SYR 20ML LL LF (SYRINGE) ×4 IMPLANT
SYR CONTROL 10ML LL (SYRINGE) ×2 IMPLANT
TUBE CONNECTING 12'X1/4 (SUCTIONS) ×1
TUBE CONNECTING 12X1/4 (SUCTIONS) ×2 IMPLANT
WATER STERILE IRR 3000ML UROMA (IV SOLUTION) ×3 IMPLANT

## 2020-11-02 NOTE — Transfer of Care (Signed)
Immediate Anesthesia Transfer of Care Note  Patient: David Odom  Procedure(s) Performed: BOTOX INJECTION WITH CYSTOSCOPY (N/A Bladder)  Patient Location: PACU  Anesthesia Type:MAC  Level of Consciousness: awake, alert  and oriented  Airway & Oxygen Therapy: Patient Spontanous Breathing  Post-op Assessment: Report given to RN  Post vital signs: Reviewed and stable  Last Vitals:  Vitals Value Taken Time  BP 123/66   Temp    Pulse 82 11/02/20 1502  Resp 20 11/02/20 1502  SpO2 99 % 11/02/20 1502  Vitals shown include unvalidated device data.  Last Pain:  Vitals:   11/02/20 1330  TempSrc: Oral  PainSc: 0-No pain      Patients Stated Pain Goal: 5 (37/62/83 1517)  Complications: No complications documented.

## 2020-11-02 NOTE — Anesthesia Postprocedure Evaluation (Signed)
Anesthesia Post Note  Patient: David Odom  Procedure(s) Performed: BOTOX INJECTION WITH CYSTOSCOPY (N/A Bladder)     Patient location during evaluation: PACU Anesthesia Type: MAC Level of consciousness: awake and alert and awake Pain management: pain level controlled Vital Signs Assessment: post-procedure vital signs reviewed and stable Respiratory status: spontaneous breathing, nonlabored ventilation, respiratory function stable and patient connected to nasal cannula oxygen Cardiovascular status: stable and blood pressure returned to baseline Postop Assessment: no apparent nausea or vomiting Anesthetic complications: no   No complications documented.  Last Vitals:  Vitals:   11/02/20 1330 11/02/20 1515  BP: (!) 157/77 126/69  Pulse: 92 82  Resp: 15 19  Temp: 37 C   SpO2: 99% 98%    Last Pain:  Vitals:   11/02/20 1330  TempSrc: Oral  PainSc: 0-No pain                 Catalina Gravel

## 2020-11-02 NOTE — Discharge Instructions (Signed)
  What can I expect after procedure? After your procedure, it is common to have:  Blood-tinged urine.  Burning or soreness when you pass urine. Follow these instructions at home: Medicines  Take over-the-counter and prescription medicines only as told by your health care provider.  If you were prescribed an antibiotic medicine, take it as told by your health care provider. Do not stop taking the antibiotic even if you start to feel better. General instructions  Do not drive for 24 hours if you were given a sedative during your procedure.  Drink enough fluid to keep your urine pale yellow.  Return to your normal activities as told by your health care provider. Ask your health care provider what activities are safe for you.  Keep all follow-up visits as told by your health care provider. This is important.   Contact a health care provider if you have:  A fever or chills.  Blood-tinged urine for more than one day after your procedure.  Worsening pain or burning when you pass urine.  Pain or burning when passing urine for more than two days after your procedure.  Trouble emptying your bladder. Get help right away if you:  Have bright red blood in your urine.  Are unable to pass urine. Summary  A botulinum toxin bladder injection is a procedure to treat an overactive bladder.  This is generally a very safe procedure. However, problems may occur, including not being able to pass urine, bleeding, infection, pain, and allergic reactions to medicines.  You will be told when to stop eating and drinking, and what medicines to change or stop. Follow instructions carefully.  After the procedure, it is common to have blood in urine and to have soreness or burning when passing urine.  Contact a health care provider if you have a fever, have blood in urine for more than a few days, or have trouble passing urine. Get help right away if you have bright red blood in the urine, or if you  are unable to pass urine. This information is not intended to replace advice given to you by your health care provider. Make sure you discuss any questions you have with your health care provider. Document Revised: 12/11/2017 Document Reviewed: 12/11/2017 Elsevier Patient Education  2021 [employer]  Post Anesthesia Home Care Instructions  Activity: Get plenty of rest for the remainder of the day. A responsible individual must stay with you for 24 hours following the procedure.  For the next 24 hours, DO NOT: -Drive a car -Advertising copywriter -Drink alcoholic beverages -Take any medication unless instructed by your physician -Make any legal decisions or sign important papers.  Meals: Start with liquid foods such as gelatin or soup. Progress to regular foods as tolerated. Avoid greasy, spicy, heavy foods. If nausea and/or vomiting occur, drink only clear liquids until the nausea and/or vomiting subsides. Call your physician if vomiting continues.  Special Instructions/Symptoms: Your throat may feel dry or sore from the anesthesia or the breathing tube placed in your throat during surgery. If this causes discomfort, gargle with warm salt water. The discomfort should disappear within 24 hours.

## 2020-11-02 NOTE — Op Note (Signed)
Operative Note  Preoperative diagnosis:  1.  Neurogenic bladder 2.  Urge incontinence  Postoperative diagnosis: 1.  Neurogenic bladder 2.  Urge incontinence  Procedure(s): 1.  Cystoscopy with intravesical Botox injections (300 units)  Surgeon: Rhoderick Moody, MD  Assistants:  None  Anesthesia:  General  Complications:  None  EBL: Less than 5 mL  Specimens: 1.  None  Drains/Catheters: 1.  None  Intraoperative findings:   1. No intravesical lesions or pathology  Indication:  David Odom is a 60 y.o. 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  The patient is here today for intravesical Botox injections.  He has been consented for the above procedures, voices understanding wishes to proceed.  Description of procedure:  After informed consent was obtained, the patient was brought to the operating room and general  anesthesia was administered. The patient was then placed in the dorsolithotomy position and prepped and draped in the usual sterile fashion. A timeout was performed. A 23 French rigid cystoscope was then inserted into the urethral meatus and advanced into the bladder under direct vision. A complete bladder survey revealed no intravesical pathology.  A total of 300 units of Botox diluted in 10 mL per 100 unit was injected and 0.5 mL aliquots in a grid like fashion throughout the bladder.  There was no significant bleeding following the injections.  The patient's bladder was drained.  He tolerated the procedure well and was transferred to the postanesthesia in stable condition.  Plan: Follow-up in 6 months to plan his next Botox injection
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
## 2020-11-02 NOTE — Anesthesia Procedure Notes (Signed)
Procedure Name: MAC Date/Time: 11/02/2020 2:32 PM Performed by: Bonney Aid, CRNA Pre-anesthesia Checklist: Patient identified, Emergency Drugs available, Suction available, Patient being monitored and Timeout performed Patient Re-evaluated:Patient Re-evaluated prior to induction Oxygen Delivery Method: Simple face mask Placement Confirmation: positive ETCO2

## 2020-11-05 ENCOUNTER — Encounter (HOSPITAL_BASED_OUTPATIENT_CLINIC_OR_DEPARTMENT_OTHER): Payer: Self-pay | Admitting: Urology

## 2020-11-06 DIAGNOSIS — E1140 Type 2 diabetes mellitus with diabetic neuropathy, unspecified: Secondary | ICD-10-CM | POA: Diagnosis not present

## 2020-11-06 DIAGNOSIS — M1310 Monoarthritis, not elsewhere classified, unspecified site: Secondary | ICD-10-CM | POA: Diagnosis not present

## 2020-11-06 DIAGNOSIS — G4733 Obstructive sleep apnea (adult) (pediatric): Secondary | ICD-10-CM | POA: Diagnosis not present

## 2020-11-06 DIAGNOSIS — G8220 Paraplegia, unspecified: Secondary | ICD-10-CM | POA: Diagnosis not present

## 2020-11-06 DIAGNOSIS — N319 Neuromuscular dysfunction of bladder, unspecified: Secondary | ICD-10-CM | POA: Diagnosis not present

## 2021-01-22 DIAGNOSIS — N319 Neuromuscular dysfunction of bladder, unspecified: Secondary | ICD-10-CM | POA: Diagnosis not present

## 2021-01-22 DIAGNOSIS — R339 Retention of urine, unspecified: Secondary | ICD-10-CM | POA: Diagnosis not present

## 2021-02-12 ENCOUNTER — Emergency Department (HOSPITAL_COMMUNITY): Payer: Medicare HMO

## 2021-02-12 ENCOUNTER — Emergency Department (HOSPITAL_COMMUNITY)
Admission: EM | Admit: 2021-02-12 | Discharge: 2021-02-13 | Disposition: A | Discharge status: Home / Self Care | Payer: Medicare HMO | Attending: Emergency Medicine | Admitting: Emergency Medicine

## 2021-02-12 DIAGNOSIS — R1010 Upper abdominal pain, unspecified: Secondary | ICD-10-CM | POA: Diagnosis not present

## 2021-02-12 DIAGNOSIS — K8020 Calculus of gallbladder without cholecystitis without obstruction: Secondary | ICD-10-CM | POA: Insufficient documentation

## 2021-02-12 DIAGNOSIS — E119 Type 2 diabetes mellitus without complications: Secondary | ICD-10-CM | POA: Insufficient documentation

## 2021-02-12 DIAGNOSIS — D72829 Elevated white blood cell count, unspecified: Secondary | ICD-10-CM | POA: Diagnosis not present

## 2021-02-12 DIAGNOSIS — R110 Nausea: Secondary | ICD-10-CM | POA: Diagnosis not present

## 2021-02-12 DIAGNOSIS — K8050 Calculus of bile duct without cholangitis or cholecystitis without obstruction: Secondary | ICD-10-CM | POA: Diagnosis not present

## 2021-02-12 DIAGNOSIS — R109 Unspecified abdominal pain: Secondary | ICD-10-CM | POA: Diagnosis not present

## 2021-02-12 DIAGNOSIS — K760 Fatty (change of) liver, not elsewhere classified: Secondary | ICD-10-CM | POA: Diagnosis not present

## 2021-02-12 DIAGNOSIS — R1011 Right upper quadrant pain: Secondary | ICD-10-CM | POA: Diagnosis present

## 2021-02-12 LAB — URINALYSIS, ROUTINE W REFLEX MICROSCOPIC
Bilirubin Urine: NEGATIVE
Glucose, UA: NEGATIVE mg/dL
Hgb urine dipstick: NEGATIVE
Ketones, ur: 20 mg/dL — AB
Nitrite: NEGATIVE
Protein, ur: NEGATIVE mg/dL
Specific Gravity, Urine: 1.017 (ref 1.005–1.030)
pH: 5 (ref 5.0–8.0)

## 2021-02-12 LAB — CBC WITH DIFFERENTIAL/PLATELET
Abs Immature Granulocytes: 0.06 10*3/uL (ref 0.00–0.07)
Basophils Absolute: 0 10*3/uL (ref 0.0–0.1)
Basophils Relative: 0 %
Eosinophils Absolute: 0.1 10*3/uL (ref 0.0–0.5)
Eosinophils Relative: 1 %
HCT: 47.1 % (ref 39.0–52.0)
Hemoglobin: 15.9 g/dL (ref 13.0–17.0)
Immature Granulocytes: 0 %
Lymphocytes Relative: 10 %
Lymphs Abs: 1.4 10*3/uL (ref 0.7–4.0)
MCH: 31.4 pg (ref 26.0–34.0)
MCHC: 33.8 g/dL (ref 30.0–36.0)
MCV: 92.9 fL (ref 80.0–100.0)
Monocytes Absolute: 0.6 10*3/uL (ref 0.1–1.0)
Monocytes Relative: 5 %
Neutro Abs: 11.4 10*3/uL — ABNORMAL HIGH (ref 1.7–7.7)
Neutrophils Relative %: 84 %
Platelets: 262 10*3/uL (ref 150–400)
RBC: 5.07 MIL/uL (ref 4.22–5.81)
RDW: 13.1 % (ref 11.5–15.5)
WBC: 13.6 10*3/uL — ABNORMAL HIGH (ref 4.0–10.5)
nRBC: 0 % (ref 0.0–0.2)

## 2021-02-12 NOTE — ED Provider Notes (Addendum)
Emergency Medicine Provider Triage Evaluation Note  David Odom , a 60 y.o. male  was evaluated in triage.  Pt complains of right upper quadrant abdominal pain and nausea onset immediately after dinner tonight.  No previous abdominal surgeries.  Patient reports 1 episode prior however abated spontaneously.  No major medical problems aside paraplegia patient.  No vomiting tonight.  Shortness of breath.  Review of Systems  Positive: Reports abdominal pain and nausea Negative: Chest pain, shortness of breath.  Physical Exam  BP 138/82 (BP Location: Right Arm)   Pulse 79   Temp 97.6 F (36.4 C) (Oral)   Resp 18   SpO2 100%  Gen:   Awake, no distress, comfortable appearing Resp:  Normal effort  MSK:   Wheelchair bound Other:  Tenderness to palpation in the right upper quadrant  Medical Decision Making  Medically screening exam initiated at 11:02 PM.  Appropriate orders placed.  Marvetta Gibbons was informed that the remainder of the evaluation will be completed by another provider, this initial triage assessment does not replace that evaluation, and the importance of remaining in the ED until their evaluation is complete.  Concern for cholecystitis.  Labs and imaging pending.   Courage Biglow, Boyd Kerbs 02/12/21 2311    Lacey Dotson, Dahlia Client, PA-C 02/13/21 0007    Amyiah Gaba, Dahlia Client, PA-C 02/13/21 0010    Dione Booze, MD 02/13/21 [phone]

## 2021-02-12 NOTE — ED Triage Notes (Signed)
Pt c/o RUQ abd pain, began tonight after dinner. Sharp, "catches breath" Endorses previous episode of same, states it "eventually went away." No abd surgery, gallbladder & appendix intact Normal BM, last BM tonight

## 2021-02-13 DIAGNOSIS — R110 Nausea: Secondary | ICD-10-CM | POA: Diagnosis not present

## 2021-02-13 DIAGNOSIS — R1010 Upper abdominal pain, unspecified: Secondary | ICD-10-CM | POA: Diagnosis not present

## 2021-02-13 LAB — COMPREHENSIVE METABOLIC PANEL
ALT: 34 U/L (ref 0–44)
AST: 25 U/L (ref 15–41)
Albumin: 3.7 g/dL (ref 3.5–5.0)
Alkaline Phosphatase: 66 U/L (ref 38–126)
Anion gap: 8 (ref 5–15)
BUN: 16 mg/dL (ref 6–20)
CO2: 26 mmol/L (ref 22–32)
Calcium: 8.9 mg/dL (ref 8.9–10.3)
Chloride: 104 mmol/L (ref 98–111)
Creatinine, Ser: 0.73 mg/dL (ref 0.61–1.24)
GFR, Estimated: >60 mL/min (ref >60)
Glucose, Bld: 143 mg/dL — ABNORMAL HIGH (ref 70–99)
Potassium: 4.4 mmol/L (ref 3.5–5.1)
Sodium: 138 mmol/L (ref 135–145)
Total Bilirubin: 0.6 mg/dL (ref 0.3–1.2)
Total Protein: 6.6 g/dL (ref 6.5–8.1)

## 2021-02-13 LAB — TROPONIN I (HIGH SENSITIVITY)
Troponin I (High Sensitivity): 3 ng/L (ref <18)
Troponin I (High Sensitivity): 3 ng/L (ref <18)

## 2021-02-13 LAB — LIPASE, BLOOD: Lipase: 34 U/L (ref 11–51)

## 2021-02-13 NOTE — ED Notes (Signed)
Patient verbalizes understanding of discharge instructions. Opportunity for questioning and answers were provided. Armband removed by staff, pt discharged from ED via wheelchair.  
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
## 2021-02-13 NOTE — Discharge Instructions (Addendum)
Return if you are having any problems. 

## 2021-02-14 DIAGNOSIS — M5416 Radiculopathy, lumbar region: Secondary | ICD-10-CM | POA: Diagnosis not present

## 2021-02-14 DIAGNOSIS — R030 Elevated blood-pressure reading, without diagnosis of hypertension: Secondary | ICD-10-CM | POA: Diagnosis not present

## 2021-02-15 DIAGNOSIS — Z0100 Encounter for examination of eyes and vision without abnormal findings: Secondary | ICD-10-CM | POA: Diagnosis not present

## 2021-02-15 DIAGNOSIS — E119 Type 2 diabetes mellitus without complications: Secondary | ICD-10-CM | POA: Diagnosis not present

## 2021-02-19 ENCOUNTER — Other Ambulatory Visit (HOSPITAL_COMMUNITY): Payer: Self-pay | Admitting: Neurological Surgery

## 2021-02-19 ENCOUNTER — Other Ambulatory Visit: Payer: Self-pay | Admitting: Neurological Surgery

## 2021-02-19 DIAGNOSIS — M5416 Radiculopathy, lumbar region: Secondary | ICD-10-CM

## 2021-02-25 ENCOUNTER — Ambulatory Visit: Payer: Self-pay | Admitting: Surgery

## 2021-02-25 DIAGNOSIS — E119 Type 2 diabetes mellitus without complications: Secondary | ICD-10-CM | POA: Diagnosis not present

## 2021-02-25 DIAGNOSIS — Z789 Other specified health status: Secondary | ICD-10-CM | POA: Diagnosis not present

## 2021-02-25 DIAGNOSIS — K8010 Calculus of gallbladder with chronic cholecystitis without obstruction: Secondary | ICD-10-CM | POA: Diagnosis not present

## 2021-02-25 DIAGNOSIS — G8220 Paraplegia, unspecified: Secondary | ICD-10-CM | POA: Diagnosis not present

## 2021-02-28 ENCOUNTER — Encounter (HOSPITAL_COMMUNITY): Payer: Self-pay

## 2021-02-28 ENCOUNTER — Ambulatory Visit (HOSPITAL_COMMUNITY): Payer: Medicare HMO

## 2021-02-28 ENCOUNTER — Ambulatory Visit (HOSPITAL_COMMUNITY): Admission: RE | Admit: 2021-02-28 | Payer: Medicare HMO | Source: Ambulatory Visit

## 2021-03-04 ENCOUNTER — Other Ambulatory Visit: Payer: Self-pay | Admitting: Student

## 2021-03-04 DIAGNOSIS — M546 Pain in thoracic spine: Secondary | ICD-10-CM

## 2021-03-04 DIAGNOSIS — M5416 Radiculopathy, lumbar region: Secondary | ICD-10-CM

## 2021-03-08 ENCOUNTER — Ambulatory Visit
Admission: RE | Admit: 2021-03-08 | Discharge: 2021-03-08 | Disposition: A | Discharge status: Home / Self Care | Payer: Medicare HMO | Source: Ambulatory Visit | Attending: Student | Admitting: Student

## 2021-03-08 ENCOUNTER — Other Ambulatory Visit: Payer: Self-pay

## 2021-03-08 DIAGNOSIS — M5126 Other intervertebral disc displacement, lumbar region: Secondary | ICD-10-CM | POA: Diagnosis not present

## 2021-03-08 DIAGNOSIS — M546 Pain in thoracic spine: Secondary | ICD-10-CM | POA: Diagnosis not present

## 2021-03-08 DIAGNOSIS — M5416 Radiculopathy, lumbar region: Secondary | ICD-10-CM

## 2021-03-08 DIAGNOSIS — M4324 Fusion of spine, thoracic region: Secondary | ICD-10-CM | POA: Diagnosis not present

## 2021-03-08 DIAGNOSIS — M4316 Spondylolisthesis, lumbar region: Secondary | ICD-10-CM | POA: Diagnosis not present

## 2021-03-08 DIAGNOSIS — M48061 Spinal stenosis, lumbar region without neurogenic claudication: Secondary | ICD-10-CM | POA: Diagnosis not present

## 2021-03-08 DIAGNOSIS — M5136 Other intervertebral disc degeneration, lumbar region: Secondary | ICD-10-CM | POA: Diagnosis not present

## 2021-03-08 MED ORDER — MEPERIDINE HCL 50 MG/ML IJ SOLN: 50.0000 mg | Freq: Once | INTRAMUSCULAR | Status: DC | PRN

## 2021-03-08 MED ORDER — IOPAMIDOL (ISOVUE-M 300) INJECTION 61%
10.0000 mL | Freq: Once | INTRAMUSCULAR | Status: AC
  Administered 2021-03-08: 10 mL via INTRATHECAL

## 2021-03-08 MED ORDER — ONDANSETRON HCL 4 MG/2ML IJ SOLN: 4.0000 mg | Freq: Once | INTRAMUSCULAR | Status: DC | PRN

## 2021-03-08 MED ORDER — DIAZEPAM 5 MG PO TABS: 10.0000 mg | ORAL_TABLET | Freq: Once | ORAL | Status: DC

## 2021-03-08 NOTE — Discharge Instructions (Signed)

## 2021-03-21 DIAGNOSIS — R030 Elevated blood-pressure reading, without diagnosis of hypertension: Secondary | ICD-10-CM | POA: Diagnosis not present

## 2021-03-21 DIAGNOSIS — M5416 Radiculopathy, lumbar region: Secondary | ICD-10-CM | POA: Diagnosis not present

## 2021-04-04 DIAGNOSIS — N3281 Overactive bladder: Secondary | ICD-10-CM | POA: Diagnosis not present

## 2021-04-04 DIAGNOSIS — N319 Neuromuscular dysfunction of bladder, unspecified: Secondary | ICD-10-CM | POA: Diagnosis not present

## 2021-04-04 DIAGNOSIS — R8279 Other abnormal findings on microbiological examination of urine: Secondary | ICD-10-CM | POA: Diagnosis not present

## 2021-04-04 NOTE — Progress Notes (Signed)
For Short Stay: COVID SWAB appointment date: Date of COVID positive in last 90 days:   For Anesthesia: PCP - Georgann Housekeeper, MD Cardiologist -   Chest x-ray - 02/13/21 in epic EKG - Stress Test -  ECHO -  Cardiac Cath -  Pacemaker/ICD device last checked:  Sleep Study -  CPAP -   Fasting Blood Sugar -  Checks Blood Sugar _____ times a day  Blood Thinner Instructions: Aspirin Instructions: Last Dose:  Activity level: Can go up a flight of stairs and activities of daily living without stopping and without chest pain and/or shortness of breath   Able to exercise without chest pain and/or shortness of breath   Unable to go up a flight of stairs without chest pain and/or shortness of breath     Anesthesia review:   Patient denies shortness of breath, fever, cough and chest pain at PAT appointment   Patient verbalized understanding of instructions that were given to them at the PAT appointment. Patient was also instructed that they will need to review over the PAT instructions again at home before surgery.

## 2021-04-08 DIAGNOSIS — M5416 Radiculopathy, lumbar region: Secondary | ICD-10-CM | POA: Diagnosis not present

## 2021-04-08 DIAGNOSIS — G8220 Paraplegia, unspecified: Secondary | ICD-10-CM | POA: Diagnosis not present

## 2021-04-09 DIAGNOSIS — R339 Retention of urine, unspecified: Secondary | ICD-10-CM | POA: Diagnosis not present

## 2021-04-09 DIAGNOSIS — N319 Neuromuscular dysfunction of bladder, unspecified: Secondary | ICD-10-CM | POA: Diagnosis not present

## 2021-04-10 ENCOUNTER — Other Ambulatory Visit: Payer: Self-pay

## 2021-04-10 ENCOUNTER — Encounter (HOSPITAL_COMMUNITY): Payer: Self-pay | Admitting: Surgery

## 2021-04-10 NOTE — Progress Notes (Addendum)
Anesthesia Review:  PCP: DR Georgann Housekeeper  LOV 11/06/20.  Requested most recent ov note along with most recent hgba1c results..  They are to fax.  Cardiologist : none  [facility]- LOV - 03/21/2021   Chest x-ray : EKG : to be done DOS  Echo : Stress test: Cardiac Cath :  Activity level:  paraplegic  Sleep Study/ CPAP : has cpap  Fasting Blood Sugar :      / Checks Blood Sugar -- times a day:   Blood Thinner/ Instructions /Last Dose: ASA / Instructions/ Last Dose :   DM- type 2  Checks glucose once daily  HGBA1c- - DOS
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
## 2021-04-22 ENCOUNTER — Other Ambulatory Visit: Payer: Self-pay | Admitting: Urology

## 2021-04-24 ENCOUNTER — Encounter (HOSPITAL_COMMUNITY): Payer: Self-pay | Admitting: Surgery

## 2021-04-25 ENCOUNTER — Ambulatory Visit (HOSPITAL_COMMUNITY)
Admission: RE | Admit: 2021-04-25 | Discharge: 2021-04-25 | Disposition: A | Discharge status: Home / Self Care | Payer: Medicare HMO | Source: Ambulatory Visit | Attending: Surgery | Admitting: Surgery

## 2021-04-25 ENCOUNTER — Encounter (HOSPITAL_COMMUNITY)
Admission: RE | Disposition: A | Discharge status: Home / Self Care | Payer: Self-pay | Source: Ambulatory Visit | Attending: Surgery

## 2021-04-25 ENCOUNTER — Other Ambulatory Visit (HOSPITAL_COMMUNITY): Payer: Self-pay | Admitting: Surgery

## 2021-04-25 ENCOUNTER — Encounter (HOSPITAL_COMMUNITY): Payer: Self-pay | Admitting: Surgery

## 2021-04-25 ENCOUNTER — Ambulatory Visit (HOSPITAL_COMMUNITY): Payer: Medicare HMO | Admitting: Physician Assistant

## 2021-04-25 ENCOUNTER — Ambulatory Visit (HOSPITAL_COMMUNITY): Payer: Medicare HMO

## 2021-04-25 DIAGNOSIS — G4730 Sleep apnea, unspecified: Secondary | ICD-10-CM | POA: Diagnosis not present

## 2021-04-25 DIAGNOSIS — E119 Type 2 diabetes mellitus without complications: Secondary | ICD-10-CM | POA: Diagnosis not present

## 2021-04-25 DIAGNOSIS — Z01818 Encounter for other preprocedural examination: Secondary | ICD-10-CM

## 2021-04-25 DIAGNOSIS — K760 Fatty (change of) liver, not elsewhere classified: Secondary | ICD-10-CM | POA: Diagnosis not present

## 2021-04-25 DIAGNOSIS — K7581 Nonalcoholic steatohepatitis (NASH): Secondary | ICD-10-CM | POA: Diagnosis not present

## 2021-04-25 DIAGNOSIS — Z419 Encounter for procedure for purposes other than remedying health state, unspecified: Secondary | ICD-10-CM

## 2021-04-25 DIAGNOSIS — K8010 Calculus of gallbladder with chronic cholecystitis without obstruction: Secondary | ICD-10-CM | POA: Insufficient documentation

## 2021-04-25 DIAGNOSIS — Z9989 Dependence on other enabling machines and devices: Secondary | ICD-10-CM | POA: Diagnosis not present

## 2021-04-25 DIAGNOSIS — K8050 Calculus of bile duct without cholangitis or cholecystitis without obstruction: Secondary | ICD-10-CM | POA: Diagnosis not present

## 2021-04-25 DIAGNOSIS — D649 Anemia, unspecified: Secondary | ICD-10-CM | POA: Diagnosis not present

## 2021-04-25 HISTORY — PX: CHOLECYSTECTOMY: SHX55

## 2021-04-25 HISTORY — PX: LIVER BIOPSY: SHX301

## 2021-04-25 LAB — BASIC METABOLIC PANEL
Anion gap: 6 (ref 5–15)
BUN: 11 mg/dL (ref 8–23)
CO2: 27 mmol/L (ref 22–32)
Calcium: 8.5 mg/dL — ABNORMAL LOW (ref 8.9–10.3)
Chloride: 105 mmol/L (ref 98–111)
Creatinine, Ser: 0.64 mg/dL (ref 0.61–1.24)
GFR, Estimated: >60 mL/min (ref >60)
Glucose, Bld: 86 mg/dL (ref 70–99)
Potassium: 3.5 mmol/L (ref 3.5–5.1)
Sodium: 138 mmol/L (ref 135–145)

## 2021-04-25 LAB — CBC
HCT: 47 % (ref 39.0–52.0)
Hemoglobin: 15.6 g/dL (ref 13.0–17.0)
MCH: 31.3 pg (ref 26.0–34.0)
MCHC: 33.2 g/dL (ref 30.0–36.0)
MCV: 94.2 fL (ref 80.0–100.0)
Platelets: 217 10*3/uL (ref 150–400)
RBC: 4.99 MIL/uL (ref 4.22–5.81)
RDW: 13.9 % (ref 11.5–15.5)
WBC: 5.3 10*3/uL (ref 4.0–10.5)
nRBC: 0 % (ref 0.0–0.2)

## 2021-04-25 LAB — HEMOGLOBIN A1C
Hgb A1c MFr Bld: 5 % (ref 4.8–5.6)
Mean Plasma Glucose: 96.8 mg/dL

## 2021-04-25 LAB — GLUCOSE, CAPILLARY
Glucose-Capillary: 73 mg/dL (ref 70–99)
Glucose-Capillary: 140 mg/dL — ABNORMAL HIGH (ref 70–99)

## 2021-04-25 SURGERY — LAPAROSCOPIC CHOLECYSTECTOMY
Anesthesia: General

## 2021-04-25 MED ORDER — HYDROMORPHONE HCL 1 MG/ML IJ SOLN
0.2500 mg | INTRAMUSCULAR | Status: DC | PRN
  Administered 2021-04-25: 0.5 mg via INTRAVENOUS

## 2021-04-25 MED ORDER — ORAL CARE MOUTH RINSE: 15.0000 mL | Freq: Once | OROMUCOSAL | Status: AC

## 2021-04-25 MED ORDER — BUPIVACAINE-EPINEPHRINE (PF) 0.25% -1:200000 IJ SOLN
INTRAMUSCULAR | Status: AC
  Filled 2021-04-25: qty 30

## 2021-04-25 MED ORDER — LACTATED RINGERS IR SOLN
Status: DC | PRN
  Administered 2021-04-25 (×2): 1

## 2021-04-25 MED ORDER — ONDANSETRON HCL 4 MG/2ML IJ SOLN
INTRAMUSCULAR | Status: DC | PRN
  Administered 2021-04-25: 4 mg via INTRAVENOUS

## 2021-04-25 MED ORDER — DEXAMETHASONE SODIUM PHOSPHATE 10 MG/ML IJ SOLN
INTRAMUSCULAR | Status: DC | PRN
  Administered 2021-04-25: 8 mg via INTRAVENOUS

## 2021-04-25 MED ORDER — ACETAMINOPHEN 500 MG PO TABS
1000.0000 mg | ORAL_TABLET | ORAL | Status: AC
  Administered 2021-04-25: 1000 mg via ORAL
  Filled 2021-04-25: qty 2

## 2021-04-25 MED ORDER — MIDAZOLAM HCL 2 MG/2ML IJ SOLN
INTRAMUSCULAR | Status: AC
  Filled 2021-04-25: qty 2

## 2021-04-25 MED ORDER — PROPOFOL 10 MG/ML IV BOLUS
INTRAVENOUS | Status: DC | PRN
  Administered 2021-04-25: 110 mg via INTRAVENOUS

## 2021-04-25 MED ORDER — METRONIDAZOLE 500 MG/100ML IV SOLN
500.0000 mg | INTRAVENOUS | Status: AC
  Administered 2021-04-25: 500 mg via INTRAVENOUS
  Filled 2021-04-25: qty 100

## 2021-04-25 MED ORDER — CELECOXIB 200 MG PO CAPS
200.0000 mg | ORAL_CAPSULE | ORAL | Status: AC
  Administered 2021-04-25: 200 mg via ORAL
  Filled 2021-04-25: qty 1

## 2021-04-25 MED ORDER — CHLORHEXIDINE GLUCONATE CLOTH 2 % EX PADS: 6.0000 | MEDICATED_PAD | Freq: Once | CUTANEOUS | Status: DC

## 2021-04-25 MED ORDER — LIDOCAINE 2% (20 MG/ML) 5 ML SYRINGE
INTRAMUSCULAR | Status: DC | PRN
  Administered 2021-04-25: 40 mg via INTRAVENOUS

## 2021-04-25 MED ORDER — BUPIVACAINE LIPOSOME 1.3 % IJ SUSP: 20.0000 mL | Freq: Once | INTRAMUSCULAR | Status: DC

## 2021-04-25 MED ORDER — PHENYLEPHRINE 40 MCG/ML (10ML) SYRINGE FOR IV PUSH (FOR BLOOD PRESSURE SUPPORT)
PREFILLED_SYRINGE | INTRAVENOUS | Status: AC
  Filled 2021-04-25: qty 10

## 2021-04-25 MED ORDER — BUPIVACAINE LIPOSOME 1.3 % IJ SUSP
INTRAMUSCULAR | Status: AC
  Filled 2021-04-25: qty 20

## 2021-04-25 MED ORDER — SODIUM CHLORIDE 0.9 % IR SOLN
Status: DC | PRN
  Administered 2021-04-25: 2000 mL

## 2021-04-25 MED ORDER — BUPIVACAINE-EPINEPHRINE 0.25% -1:200000 IJ SOLN
INTRAMUSCULAR | Status: DC | PRN
  Administered 2021-04-25: 30 mL

## 2021-04-25 MED ORDER — HYDROMORPHONE HCL 1 MG/ML IJ SOLN
INTRAMUSCULAR | Status: AC
  Filled 2021-04-25: qty 1

## 2021-04-25 MED ORDER — MIDAZOLAM HCL 5 MG/5ML IJ SOLN
INTRAMUSCULAR | Status: DC | PRN
  Administered 2021-04-25: 2 mg via INTRAVENOUS

## 2021-04-25 MED ORDER — CHLORHEXIDINE GLUCONATE 0.12 % MT SOLN
15.0000 mL | Freq: Once | OROMUCOSAL | Status: AC
  Administered 2021-04-25: 15 mL via OROMUCOSAL

## 2021-04-25 MED ORDER — FENTANYL CITRATE (PF) 250 MCG/5ML IJ SOLN
INTRAMUSCULAR | Status: AC
  Filled 2021-04-25: qty 5

## 2021-04-25 MED ORDER — ENSURE PRE-SURGERY PO LIQD
296.0000 mL | Freq: Once | ORAL | Status: DC
  Filled 2021-04-25: qty 296

## 2021-04-25 MED ORDER — LACTATED RINGERS IV SOLN: INTRAVENOUS | Status: DC

## 2021-04-25 MED ORDER — SUGAMMADEX SODIUM 200 MG/2ML IV SOLN
INTRAVENOUS | Status: DC | PRN
  Administered 2021-04-25: 200 mg via INTRAVENOUS

## 2021-04-25 MED ORDER — FENTANYL CITRATE (PF) 100 MCG/2ML IJ SOLN
INTRAMUSCULAR | Status: DC | PRN
  Administered 2021-04-25: 100 ug via INTRAVENOUS
  Administered 2021-04-25: 50 ug via INTRAVENOUS

## 2021-04-25 MED ORDER — GABAPENTIN 300 MG PO CAPS
300.0000 mg | ORAL_CAPSULE | ORAL | Status: AC
  Administered 2021-04-25: 300 mg via ORAL
  Filled 2021-04-25: qty 1

## 2021-04-25 MED ORDER — SODIUM CHLORIDE 0.9 % IV SOLN
2.0000 g | INTRAVENOUS | Status: AC
  Administered 2021-04-25: 2 g via INTRAVENOUS
  Filled 2021-04-25: qty 20

## 2021-04-25 MED ORDER — BUPIVACAINE LIPOSOME 1.3 % IJ SUSP
INTRAMUSCULAR | Status: DC | PRN
  Administered 2021-04-25: 20 mL

## 2021-04-25 MED ORDER — ROCURONIUM BROMIDE 10 MG/ML (PF) SYRINGE
PREFILLED_SYRINGE | INTRAVENOUS | Status: DC | PRN
  Administered 2021-04-25: 50 mg via INTRAVENOUS
  Administered 2021-04-25: 20 mg via INTRAVENOUS

## 2021-04-25 MED ORDER — TRAMADOL HCL 50 MG PO TABS: 50.0000 mg | ORAL_TABLET | Freq: Four times a day (QID) | ORAL | 0 refills | Status: DC | PRN

## 2021-04-25 SURGICAL SUPPLY — 52 items
APPLIER CLIP 5 13 M/L LIGAMAX5 (MISCELLANEOUS) ×2
APPLIER CLIP ROT 10 11.4 M/L (STAPLE)
APR CLP MED LRG 11.4X10 (STAPLE)
APR CLP MED LRG 5 ANG JAW (MISCELLANEOUS) ×1
BAG COUNTER SPONGE SURGICOUNT (BAG) ×2 IMPLANT
BAG SPEC RTRVL 10 TROC 200 (ENDOMECHANICALS) ×1
BAG SPNG CNTER NS LX DISP (BAG) ×1
CABLE HIGH FREQUENCY MONO STRZ (ELECTRODE) IMPLANT
CLIP APPLIE 5 13 M/L LIGAMAX5 (MISCELLANEOUS) IMPLANT
CLIP APPLIE ROT 10 11.4 M/L (STAPLE) IMPLANT
COVER SURGICAL LIGHT HANDLE (MISCELLANEOUS) ×2 IMPLANT
DECANTER SPIKE VIAL GLASS SM (MISCELLANEOUS) ×2 IMPLANT
DRAPE 3/4 80X56 (DRAPES) ×2 IMPLANT
DRAPE C-ARM 42X120 X-RAY (DRAPES) ×3 IMPLANT
DRAPE UTILITY XL STRL (DRAPES) ×2 IMPLANT
DRAPE WARM FLUID 44X44 (DRAPES) ×2 IMPLANT
DRSG TEGADERM 2-3/8X2-3/4 SM (GAUZE/BANDAGES/DRESSINGS) ×4 IMPLANT
DRSG TEGADERM 4X4.75 (GAUZE/BANDAGES/DRESSINGS) ×1 IMPLANT
DRSG TEGADERM 6X8 (GAUZE/BANDAGES/DRESSINGS) ×2 IMPLANT
DRSG TELFA 3X8 NADH (GAUZE/BANDAGES/DRESSINGS) ×2 IMPLANT
ELECT REM PT RETURN 15FT ADLT (MISCELLANEOUS) ×2 IMPLANT
ENDOLOOP SUT PDS II  0 18 (SUTURE) ×2
ENDOLOOP SUT PDS II 0 18 (SUTURE) IMPLANT
GAUZE SPONGE 2X2 8PLY STRL LF (GAUZE/BANDAGES/DRESSINGS) IMPLANT
GLOVE SURG NEOPR MICRO LF SZ8 (GLOVE) ×2 IMPLANT
GLOVE SURG UNDER LTX SZ8 (GLOVE) ×2 IMPLANT
GOWN STRL REUS W/TWL XL LVL3 (GOWN DISPOSABLE) ×4 IMPLANT
IRRIG SUCT STRYKERFLOW 2 WTIP (MISCELLANEOUS) ×2
IRRIGATION SUCT STRKRFLW 2 WTP (MISCELLANEOUS) ×1 IMPLANT
KIT BASIN OR (CUSTOM PROCEDURE TRAY) ×2 IMPLANT
KIT TURNOVER KIT A (KITS) IMPLANT
NDL BIOPSY 14X6 SOFT TISS (NEEDLE) IMPLANT
NEEDLE BIOPSY 14X6 SOFT TISS (NEEDLE) ×2 IMPLANT
PAD DRESSING TELFA 3X8 NADH (GAUZE/BANDAGES/DRESSINGS) IMPLANT
PENCIL SMOKE EVACUATOR (MISCELLANEOUS) IMPLANT
POUCH RETRIEVAL ECOSAC 10 (ENDOMECHANICALS) ×1 IMPLANT
POUCH RETRIEVAL ECOSAC 10MM (ENDOMECHANICALS) ×2
SCISSORS LAP 5X35 DISP (ENDOMECHANICALS) ×2 IMPLANT
SET CHOLANGIOGRAPH MIX (MISCELLANEOUS) ×2 IMPLANT
SET TUBE SMOKE EVAC HIGH FLOW (TUBING) ×2 IMPLANT
SLEEVE XCEL OPT CAN 5 100 (ENDOMECHANICALS) ×1 IMPLANT
SPONGE GAUZE 2X2 STER 10/PKG (GAUZE/BANDAGES/DRESSINGS) ×1
SUT MNCRL AB 4-0 PS2 18 (SUTURE) ×2 IMPLANT
SUT PDS AB 1 CT  36 (SUTURE)
SUT PDS AB 1 CT 36 (SUTURE) IMPLANT
SUT PDS AB 1 CT1 27 (SUTURE) ×4 IMPLANT
SYR 20ML LL LF (SYRINGE) ×2 IMPLANT
TOWEL OR 17X26 10 PK STRL BLUE (TOWEL DISPOSABLE) ×2 IMPLANT
TOWEL OR NON WOVEN STRL DISP B (DISPOSABLE) ×2 IMPLANT
TRAY LAPAROSCOPIC (CUSTOM PROCEDURE TRAY) ×2 IMPLANT
TROCAR BLADELESS OPT 5 100 (ENDOMECHANICALS) ×2 IMPLANT
TROCAR XCEL NON-BLD 11X100MML (ENDOMECHANICALS) ×2 IMPLANT
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
## 2021-04-25 NOTE — Anesthesia Preprocedure Evaluation (Addendum)
Anesthesia Evaluation  Patient identified by MRN, date of birth, ID band Patient awake    Reviewed: Allergy & Precautions, H&P , NPO status , Patient's Chart, lab work & pertinent test results  Airway Mallampati: II  TM Distance: >3 FB Neck ROM: Full    Dental no notable dental hx. (+) Teeth Intact, Dental Advisory Given   Pulmonary sleep apnea and Continuous Positive Airway Pressure Ventilation ,    Pulmonary exam normal breath sounds clear to auscultation       Cardiovascular negative cardio ROS   Rhythm:Regular Rate:Normal     Neuro/Psych negative neurological ROS  negative psych ROS   GI/Hepatic negative GI ROS, Neg liver ROS,   Endo/Other  diabetes, Well Controlled  Renal/GU negative Renal ROS  negative genitourinary   Musculoskeletal   Abdominal   Peds  Hematology  (+) Blood dyscrasia, anemia ,   Anesthesia Other Findings   Reproductive/Obstetrics negative OB ROS                            Anesthesia Physical Anesthesia Plan  ASA: 3  Anesthesia Plan: General   Post-op Pain Management:    Induction: Intravenous  PONV Risk Score and Plan: 3 and Ondansetron, Dexamethasone and Midazolam  Airway Management Planned: Oral ETT  Additional Equipment:   Intra-op Plan:   Post-operative Plan: Extubation in OR  Informed Consent: I have reviewed the patients History and Physical, chart, labs and discussed the procedure including the risks, benefits and alternatives for the proposed anesthesia with the patient or authorized representative who has indicated his/her understanding and acceptance.     Dental advisory given  Plan Discussed with: CRNA  Anesthesia Plan Comments:         Anesthesia Quick Evaluation

## 2021-04-25 NOTE — Transfer of Care (Signed)
Immediate Anesthesia Transfer of Care Note  Patient: David Odom  Procedure(s) Performed: Procedure(s): SINGLE SITE LAPAROSCOPIC CHOLECYSTECTOMY (N/A) NEEDLE CORE BIOPSY OF LIVER (N/A)  Patient Location: PACU  Anesthesia Type:General  Level of Consciousness:  sedated, patient cooperative and responds to stimulation  Airway & Oxygen Therapy:Patient Spontanous Breathing and Patient connected to face mask oxgen  Post-op Assessment:  Report given to PACU RN and Post -op Vital signs reviewed and stable  Post vital signs:  Reviewed and stable  Last Vitals:  Vitals:   04/25/21 0731  BP: 137/75  Pulse: 84  Resp: 18  Temp: 36.8 C  SpO2: 98%    Complications: No apparent anesthesia complications

## 2021-04-25 NOTE — Discharge Instructions (Addendum)
################################################################ ? ?LAPAROSCOPIC SURGERY: POST OP INSTRUCTIONS ? ?###################################################################### ? ?EAT ?Gradually transition to a high fiber diet with a fiber supplement over the next few weeks after discharge.  Start with a pureed / full liquid diet (see below) ? ?WALK ?Walk an hour a day.  Control your pain to do that.   ? ?CONTROL PAIN ?Control pain so that you can walk, sleep, tolerate sneezing/coughing, go up/down stairs. ? ?HAVE A BOWEL MOVEMENT DAILY ?Keep your bowels regular to avoid problems.  OK to try a laxative to override constipation.  OK to use an antidairrheal to slow down diarrhea.  Call if not better after 2 tries ? ?CALL IF YOU HAVE PROBLEMS/CONCERNS ?Call if you are still struggling despite following these instructions. ?Call if you have concerns not answered by these instructions ? ?###################################################################### ? ? ? ?DIET: Follow a light bland diet & liquids the first 24 hours after arrival home, such as soup, liquids, starches, etc.  Be sure to drink plenty of fluids.  Quickly advance to a usual solid diet within a few days.  Avoid fast food or heavy meals as your are more likely to get nauseated or have irregular bowels.  A low-fat, high-fiber diet for the rest of your life is ideal. ? ?Take your usually prescribed home medications unless otherwise directed. ? ?PAIN CONTROL: ?Pain is best controlled by a usual combination of three different methods TOGETHER: ?Ice/Heat ?Over the counter pain medication ?Prescription pain medication ?Most patients will experience some swelling and bruising around the incisions.  Ice packs or heating pads (30-60 minutes up to 6 times a day) will help. Use ice for the first few days to help decrease swelling and bruising, then switch to heat to help relax tight/sore spots and speed recovery.  Some people prefer to use ice alone, heat  alone, alternating between ice & heat.  Experiment to what works for you.  Swelling and bruising can take several weeks to resolve.   ?It is helpful to take an over-the-counter pain medication regularly for the first few weeks.  Choose one of the following that works best for you: ?Naproxen (Aleve, etc)  Two 220mg tabs twice a day ?Ibuprofen (Advil, etc) Three 200mg tabs four times a day (every meal & bedtime) ?Acetaminophen (Tylenol, etc) 500-650mg four times a day (every meal & bedtime) ?A  prescription for pain medication (such as oxycodone, hydrocodone, tramadol, gabapentin, methocarbamol, etc) should be given to you upon discharge.  Take your pain medication as prescribed.  ?If you are having problems/concerns with the prescription medicine (does not control pain, nausea, vomiting, rash, itching, etc), please call us [phone] to see if we need to switch you to a different pain medicine that will work better for you and/or control your side effect better. ?If you need a refill on your pain medication, please give us 48 hour notice.  contact your pharmacy.  They will contact our office to request authorization. Prescriptions will not be filled after 5 pm or on week-ends ? ?Avoid getting constipated.   ?Between the surgery and the pain medications, it is common to experience some constipation.   ?Increasing fluid intake and taking a fiber supplement (such as Metamucil, Citrucel, FiberCon, MiraLax, etc) 1-2 times a day regularly will usually help prevent this problem from occurring.   ?A mild laxative (prune juice, Milk of Magnesia, MiraLax, etc) should be taken according to package directions if there are no bowel movements after 48 hours.   ?Watch out for diarrhea.   ?  If you have many loose bowel movements, simplify your diet to bland foods & liquids for a few days.   ?Stop any stool softeners and decrease your fiber supplement.   ?Switching to mild anti-diarrheal medications (Kayopectate, Pepto Bismol) can  help.   ?If this worsens or does not improve, please call us. ? ?Wash / shower every day.  You may shower over the dressings as they are waterproof.  Continue to shower over incision(s) after the dressing is off. ? ?REMOVE ALL DRESSINGS: Remove your waterproof bandages (tegaderm clear band-aids, steristrip skin tapes, etc) THREE DAYS AFTER SURGERY.  You may leave the incisions open to air.  You may replace a dressing/Band-Aid to cover the incision for comfort if you wish.  ? ?ACTIVITIES as tolerated:   ?You may resume regular (light) daily activities beginning the next day--such as daily self-care, walking, climbing stairs--gradually increasing activities as tolerated.  If you can walk 30 minutes without difficulty, it is safe to try more intense activity such as jogging, treadmill, bicycling, low-impact aerobics, swimming, etc. ?Save the most intensive and strenuous activity for last such as sit-ups, heavy lifting, contact sports, etc  Refrain from any heavy lifting or straining until you are off narcotics for pain control.   ?DO NOT PUSH THROUGH PAIN.  Let pain be your guide: If it hurts to do something, don't do it.  Pain is your body warning you to avoid that activity for another week until the pain goes down. ?You may drive when you are no longer taking prescription pain medication, you can comfortably wear a seatbelt, and you can safely maneuver your car and apply brakes. ?You may have sexual intercourse when it is comfortable. ? ?FOLLOW UP in our office ?Please call [facility] at [phone] to set up an appointment to see your surgeon in the office for a follow-up appointment approximately 2-3 weeks after your surgery. ?Make sure that you call for this appointment the day you arrive home to insure a convenient appointment time. ? ?10. IF YOU HAVE DISABILITY OR FAMILY LEAVE FORMS, BRING THEM TO THE OFFICE FOR PROCESSING.  DO NOT GIVE THEM TO YOUR DOCTOR. ? ? ?WHEN TO CALL US [phone]: ?Poor pain  control ?Reactions / problems with new medications (rash/itching, nausea, etc)  ?Fever over 101.5 F (38.5 C) ?Inability to urinate ?Nausea and/or vomiting ?Worsening swelling or bruising ?Continued bleeding from incision. ?Increased pain, redness, or drainage from the incision ? ? The clinic staff is available to answer your questions during regular business hours (8:30am-5pm).  Please don?t hesitate to call and ask to speak to one of our nurses for clinical concerns.  ? If you have a medical emergency, go to the nearest emergency room or call 911. ? A surgeon from [facility] is always on call at the hospitals ? ? ?[facility] ?[address], [address], [place], [place]  [address] ? ?MAIN: [phone] ? TOLL FREE: [phone] ?  ?FAX [phone] ?[email or web address] ? ?############################################################## ? ? ? ?

## 2021-04-25 NOTE — Anesthesia Postprocedure Evaluation (Signed)
Anesthesia Post Note  Patient: David Odom  Procedure(s) Performed: SINGLE SITE LAPAROSCOPIC CHOLECYSTECTOMY NEEDLE CORE BIOPSY OF LIVER     Patient location during evaluation: PACU Anesthesia Type: General Level of consciousness: awake and alert Pain management: pain level controlled Vital Signs Assessment: post-procedure vital signs reviewed and stable Respiratory status: spontaneous breathing, nonlabored ventilation and respiratory function stable Cardiovascular status: blood pressure returned to baseline and stable Postop Assessment: no apparent nausea or vomiting Anesthetic complications: no   No notable events documented.  Last Vitals:  Vitals:   04/25/21 1330 04/25/21 1345  BP: (!) 172/88 (!) 154/85  Pulse: 77 77  Resp: 14 12  Temp:  (!) 36.4 C  SpO2: 99% 100%    Last Pain:  Vitals:   04/25/21 1345  TempSrc:   PainSc: 2                  Nikodem Leadbetter,W. EDMOND
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
## 2021-04-25 NOTE — Anesthesia Procedure Notes (Signed)
Procedure Name: Intubation Date/Time: 04/25/2021 10:26 AM Performed by: Lavina Hamman, CRNA Pre-anesthesia Checklist: Patient identified, Emergency Drugs available, Suction available, Patient being monitored and Timeout performed Patient Re-evaluated:Patient Re-evaluated prior to induction Oxygen Delivery Method: Circle system utilized Preoxygenation: Pre-oxygenation with 100% oxygen Induction Type: IV induction Ventilation: Mask ventilation without difficulty and Oral airway inserted - appropriate to patient size Laryngoscope Size: Mac and 4 Grade View: Grade I Tube type: Oral Tube size: 7.0 mm Number of attempts: 1 Airway Equipment and Method: Stylet Placement Confirmation: ETT inserted through vocal cords under direct vision, positive ETCO2, CO2 detector and breath sounds checked- equal and bilateral Secured at: 22 cm Tube secured with: Tape Dental Injury: Teeth and Oropharynx as per pre-operative assessment  Comments: ATOI

## 2021-04-26 ENCOUNTER — Encounter (HOSPITAL_COMMUNITY): Payer: Self-pay | Admitting: Surgery

## 2021-04-26 LAB — SURGICAL PATHOLOGY

## 2021-04-30 DIAGNOSIS — R030 Elevated blood-pressure reading, without diagnosis of hypertension: Secondary | ICD-10-CM | POA: Diagnosis not present

## 2021-04-30 DIAGNOSIS — M5416 Radiculopathy, lumbar region: Secondary | ICD-10-CM | POA: Diagnosis not present

## 2021-05-07 DIAGNOSIS — N3281 Overactive bladder: Secondary | ICD-10-CM | POA: Diagnosis not present

## 2021-05-07 DIAGNOSIS — N319 Neuromuscular dysfunction of bladder, unspecified: Secondary | ICD-10-CM | POA: Diagnosis not present

## 2021-05-07 DIAGNOSIS — R8279 Other abnormal findings on microbiological examination of urine: Secondary | ICD-10-CM | POA: Diagnosis not present

## 2021-05-14 DIAGNOSIS — M545 Low back pain, unspecified: Secondary | ICD-10-CM | POA: Diagnosis not present

## 2021-05-14 DIAGNOSIS — M25552 Pain in left hip: Secondary | ICD-10-CM | POA: Diagnosis not present

## 2021-05-14 DIAGNOSIS — M19042 Primary osteoarthritis, left hand: Secondary | ICD-10-CM | POA: Diagnosis not present

## 2021-05-14 DIAGNOSIS — G8220 Paraplegia, unspecified: Secondary | ICD-10-CM | POA: Diagnosis not present

## 2021-05-14 DIAGNOSIS — Z4689 Encounter for fitting and adjustment of other specified devices: Secondary | ICD-10-CM | POA: Diagnosis not present

## 2021-05-14 DIAGNOSIS — M19041 Primary osteoarthritis, right hand: Secondary | ICD-10-CM | POA: Diagnosis not present

## 2021-05-15 DIAGNOSIS — Z4689 Encounter for fitting and adjustment of other specified devices: Secondary | ICD-10-CM | POA: Diagnosis not present

## 2021-05-15 DIAGNOSIS — M19042 Primary osteoarthritis, left hand: Secondary | ICD-10-CM | POA: Diagnosis not present

## 2021-05-15 DIAGNOSIS — G8220 Paraplegia, unspecified: Secondary | ICD-10-CM | POA: Diagnosis not present

## 2021-05-15 DIAGNOSIS — M25552 Pain in left hip: Secondary | ICD-10-CM | POA: Diagnosis not present

## 2021-05-15 DIAGNOSIS — M545 Low back pain, unspecified: Secondary | ICD-10-CM | POA: Diagnosis not present

## 2021-05-15 DIAGNOSIS — M19041 Primary osteoarthritis, right hand: Secondary | ICD-10-CM | POA: Diagnosis not present

## 2021-05-16 ENCOUNTER — Encounter (HOSPITAL_BASED_OUTPATIENT_CLINIC_OR_DEPARTMENT_OTHER): Payer: Self-pay | Admitting: Urology

## 2021-05-17 ENCOUNTER — Encounter (HOSPITAL_BASED_OUTPATIENT_CLINIC_OR_DEPARTMENT_OTHER): Payer: Self-pay | Admitting: Urology

## 2021-05-20 ENCOUNTER — Encounter (HOSPITAL_BASED_OUTPATIENT_CLINIC_OR_DEPARTMENT_OTHER): Payer: Self-pay | Admitting: Urology

## 2021-05-20 ENCOUNTER — Other Ambulatory Visit: Payer: Self-pay

## 2021-05-20 NOTE — Progress Notes (Signed)
Spoke w/ via phone for pre-op interview--- pt Lab needs dos----   [employer] and ekg            Lab results------no COVID test -----patient states asymptomatic no test needed Arrive at ------- 0630 on 05-22-2021 NPO after MN NO Solid Food.  Clear liquids from MN until--- 0530 Med rec completed Medications to take morning of surgery ----- methenamine  Diabetic medication ----- n/a Patient instructed no nail polish to be worn day of surgery Patient instructed to bring photo id and insurance card day of surgery Patient aware to have Driver (ride ) / caregiver for 24 hours after surgery --father, bill Patient Special Instructions ----- n/a Pre-Op special Istructions ----- Pt paraplegic , wheelchair dependent, transfer independently.  Patient verbalized understanding of instructions that were given at this phone interview. Patient denies shortness of breath, chest pain, fever, cough at this phone interview.

## 2021-05-21 NOTE — Anesthesia Preprocedure Evaluation (Addendum)
Anesthesia Evaluation  Patient identified by MRN, date of birth, ID band Patient awake    Reviewed: Allergy & Precautions, H&P , NPO status , Patient's Chart, lab work & pertinent test results  Airway Mallampati: III  TM Distance: >3 FB Neck ROM: Full    Dental no notable dental hx. (+) Teeth Intact, Dental Advisory Given   Pulmonary sleep apnea and Continuous Positive Airway Pressure Ventilation ,    Pulmonary exam normal breath sounds clear to auscultation       Cardiovascular Exercise Tolerance: Good + dysrhythmias  Rhythm:Regular Rate:Normal     Neuro/Psych Paraplegia negative neurological ROS  negative psych ROS   GI/Hepatic negative GI ROS, Neg liver ROS,   Endo/Other  diabetes  Renal/GU negative Renal ROS  negative genitourinary   Musculoskeletal   Abdominal   Peds  Hematology  (+) Blood dyscrasia, anemia ,   Anesthesia Other Findings   Reproductive/Obstetrics negative OB ROS                           Anesthesia Physical Anesthesia Plan  ASA: 3  Anesthesia Plan: General   Post-op Pain Management: Tylenol PO (pre-op)   Induction: Intravenous  PONV Risk Score and Plan: 3 and Ondansetron, Dexamethasone and Midazolam  Airway Management Planned: LMA  Additional Equipment:   Intra-op Plan:   Post-operative Plan: Extubation in OR  Informed Consent: I have reviewed the patients History and Physical, chart, labs and discussed the procedure including the risks, benefits and alternatives for the proposed anesthesia with the patient or authorized representative who has indicated his/her understanding and acceptance.     Dental advisory given  Plan Discussed with: CRNA  Anesthesia Plan Comments:        Anesthesia Quick Evaluation

## 2021-05-22 ENCOUNTER — Encounter (HOSPITAL_BASED_OUTPATIENT_CLINIC_OR_DEPARTMENT_OTHER): Payer: Self-pay | Admitting: Urology

## 2021-05-22 ENCOUNTER — Ambulatory Visit (HOSPITAL_BASED_OUTPATIENT_CLINIC_OR_DEPARTMENT_OTHER): Payer: Medicare HMO | Admitting: Anesthesiology

## 2021-05-22 ENCOUNTER — Encounter (HOSPITAL_BASED_OUTPATIENT_CLINIC_OR_DEPARTMENT_OTHER)
Admission: RE | Disposition: A | Discharge status: Home / Self Care | Payer: Self-pay | Source: Home / Self Care | Attending: Urology

## 2021-05-22 ENCOUNTER — Other Ambulatory Visit: Payer: Self-pay

## 2021-05-22 ENCOUNTER — Ambulatory Visit (HOSPITAL_BASED_OUTPATIENT_CLINIC_OR_DEPARTMENT_OTHER)
Admission: RE | Admit: 2021-05-22 | Discharge: 2021-05-22 | Disposition: A | Discharge status: Home / Self Care | Payer: Medicare HMO | Attending: Urology | Admitting: Urology

## 2021-05-22 DIAGNOSIS — G4730 Sleep apnea, unspecified: Secondary | ICD-10-CM | POA: Diagnosis not present

## 2021-05-22 DIAGNOSIS — Z9989 Dependence on other enabling machines and devices: Secondary | ICD-10-CM | POA: Diagnosis not present

## 2021-05-22 DIAGNOSIS — Z9049 Acquired absence of other specified parts of digestive tract: Secondary | ICD-10-CM | POA: Insufficient documentation

## 2021-05-22 DIAGNOSIS — G8220 Paraplegia, unspecified: Secondary | ICD-10-CM | POA: Diagnosis not present

## 2021-05-22 DIAGNOSIS — N3281 Overactive bladder: Secondary | ICD-10-CM | POA: Diagnosis not present

## 2021-05-22 DIAGNOSIS — R32 Unspecified urinary incontinence: Secondary | ICD-10-CM | POA: Diagnosis not present

## 2021-05-22 DIAGNOSIS — Z87440 Personal history of urinary (tract) infections: Secondary | ICD-10-CM | POA: Diagnosis not present

## 2021-05-22 DIAGNOSIS — N319 Neuromuscular dysfunction of bladder, unspecified: Secondary | ICD-10-CM | POA: Insufficient documentation

## 2021-05-22 DIAGNOSIS — E119 Type 2 diabetes mellitus without complications: Secondary | ICD-10-CM | POA: Insufficient documentation

## 2021-05-22 DIAGNOSIS — D649 Anemia, unspecified: Secondary | ICD-10-CM | POA: Diagnosis not present

## 2021-05-22 DIAGNOSIS — Z87828 Personal history of other (healed) physical injury and trauma: Secondary | ICD-10-CM | POA: Diagnosis not present

## 2021-05-22 DIAGNOSIS — G4733 Obstructive sleep apnea (adult) (pediatric): Secondary | ICD-10-CM | POA: Diagnosis not present

## 2021-05-22 HISTORY — DX: Unspecified right bundle-branch block: I45.10

## 2021-05-22 HISTORY — DX: Obstructive sleep apnea (adult) (pediatric): G47.33

## 2021-05-22 HISTORY — PX: BOTOX INJECTION: SHX5754

## 2021-05-22 LAB — POCT I-STAT, CHEM 8
BUN: 18 mg/dL (ref 8–23)
Calcium, Ion: 1.22 mmol/L (ref 1.15–1.40)
Chloride: 105 mmol/L (ref 98–111)
Creatinine, Ser: 0.7 mg/dL (ref 0.61–1.24)
Glucose, Bld: 86 mg/dL (ref 70–99)
HCT: 46 % (ref 39.0–52.0)
Hemoglobin: 15.6 g/dL (ref 13.0–17.0)
Potassium: 4 mmol/L (ref 3.5–5.1)
Sodium: 143 mmol/L (ref 135–145)
TCO2: 25 mmol/L (ref 22–32)

## 2021-05-22 LAB — GLUCOSE, CAPILLARY: Glucose-Capillary: 86 mg/dL (ref 70–99)

## 2021-05-22 SURGERY — BOTOX INJECTION
Anesthesia: General | Site: Bladder

## 2021-05-22 MED ORDER — PROPOFOL 1000 MG/100ML IV EMUL
INTRAVENOUS | Status: AC
  Filled 2021-05-22: qty 100

## 2021-05-22 MED ORDER — FENTANYL CITRATE (PF) 100 MCG/2ML IJ SOLN: 25.0000 ug | INTRAMUSCULAR | Status: DC | PRN

## 2021-05-22 MED ORDER — LIDOCAINE 2% (20 MG/ML) 5 ML SYRINGE
INTRAMUSCULAR | Status: AC
  Filled 2021-05-22: qty 5

## 2021-05-22 MED ORDER — BACITRACIN-NEOMYCIN-POLYMYXIN OINTMENT TUBE
TOPICAL_OINTMENT | CUTANEOUS | Status: AC
  Filled 2021-05-22: qty 14.17

## 2021-05-22 MED ORDER — BUPIVACAINE HCL (PF) 0.5 % IJ SOLN
INTRAMUSCULAR | Status: AC
  Filled 2021-05-22: qty 30

## 2021-05-22 MED ORDER — CEFAZOLIN SODIUM-DEXTROSE 2-4 GM/100ML-% IV SOLN
2.0000 g | Freq: Once | INTRAVENOUS | Status: AC
  Administered 2021-05-22: 2 g via INTRAVENOUS

## 2021-05-22 MED ORDER — FENTANYL CITRATE (PF) 100 MCG/2ML IJ SOLN
INTRAMUSCULAR | Status: AC
  Filled 2021-05-22: qty 2

## 2021-05-22 MED ORDER — DEXAMETHASONE SODIUM PHOSPHATE 4 MG/ML IJ SOLN
INTRAMUSCULAR | Status: DC | PRN
  Administered 2021-05-22: 10 mg via INTRAVENOUS

## 2021-05-22 MED ORDER — LACTATED RINGERS IV SOLN: INTRAVENOUS | Status: DC

## 2021-05-22 MED ORDER — ONDANSETRON HCL 4 MG/2ML IJ SOLN
INTRAMUSCULAR | Status: AC
  Filled 2021-05-22: qty 2

## 2021-05-22 MED ORDER — MIDAZOLAM HCL 2 MG/2ML IJ SOLN
INTRAMUSCULAR | Status: AC
  Filled 2021-05-22: qty 2

## 2021-05-22 MED ORDER — PROPOFOL 10 MG/ML IV BOLUS
INTRAVENOUS | Status: DC | PRN
  Administered 2021-05-22: 200 mg via INTRAVENOUS

## 2021-05-22 MED ORDER — DEXAMETHASONE SODIUM PHOSPHATE 10 MG/ML IJ SOLN
INTRAMUSCULAR | Status: AC
  Filled 2021-05-22: qty 1

## 2021-05-22 MED ORDER — FENTANYL CITRATE (PF) 100 MCG/2ML IJ SOLN
INTRAMUSCULAR | Status: DC | PRN
  Administered 2021-05-22: 50 ug via INTRAVENOUS

## 2021-05-22 MED ORDER — ONABOTULINUMTOXINA 100 UNITS IJ SOLR
INTRAMUSCULAR | Status: DC | PRN
  Administered 2021-05-22: 300 [IU] via INTRAMUSCULAR

## 2021-05-22 MED ORDER — SODIUM CHLORIDE (PF) 0.9 % IJ SOLN
INTRAMUSCULAR | Status: DC | PRN
  Administered 2021-05-22: 30 mL

## 2021-05-22 MED ORDER — CEFAZOLIN SODIUM-DEXTROSE 2-4 GM/100ML-% IV SOLN
INTRAVENOUS | Status: AC
  Filled 2021-05-22: qty 100

## 2021-05-22 MED ORDER — ACETAMINOPHEN 500 MG PO TABS
ORAL_TABLET | ORAL | Status: AC
  Filled 2021-05-22: qty 2

## 2021-05-22 MED ORDER — TRIAMCINOLONE ACETONIDE 40 MG/ML IJ SUSP
INTRAMUSCULAR | Status: AC
  Filled 2021-05-22: qty 1

## 2021-05-22 MED ORDER — SODIUM CHLORIDE (PF) 0.9 % IJ SOLN
INTRAMUSCULAR | Status: AC
  Filled 2021-05-22: qty 50

## 2021-05-22 MED ORDER — ACETAMINOPHEN 500 MG PO TABS
1000.0000 mg | ORAL_TABLET | Freq: Once | ORAL | Status: AC
  Administered 2021-05-22: 1000 mg via ORAL

## 2021-05-22 MED ORDER — ONABOTULINUMTOXINA 100 UNITS IJ SOLR
INTRAMUSCULAR | Status: AC
  Filled 2021-05-22: qty 100

## 2021-05-22 MED ORDER — ONDANSETRON HCL 4 MG/2ML IJ SOLN
INTRAMUSCULAR | Status: DC | PRN
  Administered 2021-05-22: 4 mg via INTRAVENOUS

## 2021-05-22 MED ORDER — MIDAZOLAM HCL 5 MG/5ML IJ SOLN
INTRAMUSCULAR | Status: DC | PRN
  Administered 2021-05-22: 2 mg via INTRAVENOUS

## 2021-05-22 MED ORDER — LIDOCAINE 2% (20 MG/ML) 5 ML SYRINGE
INTRAMUSCULAR | Status: DC | PRN
  Administered 2021-05-22: 60 mg via INTRAVENOUS

## 2021-05-22 MED ORDER — BACITRACIN-NEOMYCIN-POLYMYXIN OINTMENT TUBE
TOPICAL_OINTMENT | CUTANEOUS | Status: DC | PRN
  Administered 2021-05-22: 1 via TOPICAL

## 2021-05-22 MED ORDER — STERILE WATER FOR IRRIGATION IR SOLN
Status: DC | PRN
  Administered 2021-05-22: 3000 mL

## 2021-05-22 SURGICAL SUPPLY — 21 items
BAG DRAIN URO-CYSTO SKYTR STRL (DRAIN) ×2 IMPLANT
BAG DRN UROCATH (DRAIN) ×1
CATH ROBINSON RED A/P 14FR (CATHETERS) IMPLANT
CLOTH BEACON ORANGE TIMEOUT ST (SAFETY) ×1 IMPLANT
DRSG TELFA 3X8 NADH (GAUZE/BANDAGES/DRESSINGS) ×2 IMPLANT
ELECT REM PT RETURN 9FT ADLT (ELECTROSURGICAL)
ELECTRODE REM PT RTRN 9FT ADLT (ELECTROSURGICAL) ×1 IMPLANT
GLOVE SURG ENC MOIS LTX SZ7.5 (GLOVE) ×3 IMPLANT
GOWN STRL REUS W/TWL LRG LVL3 (GOWN DISPOSABLE) ×5 IMPLANT
KIT TURNOVER CYSTO (KITS) ×2 IMPLANT
MANIFOLD NEPTUNE II (INSTRUMENTS) ×2 IMPLANT
NDL ASPIRATION 22 (NEEDLE) IMPLANT
NDL SAFETY ECLIPSE 18X1.5 (NEEDLE) IMPLANT
NEEDLE ASPIRATION 22 (NEEDLE) IMPLANT
NEEDLE HYPO 18GX1.5 SHARP (NEEDLE) ×4
PACK CYSTO (CUSTOM PROCEDURE TRAY) ×2 IMPLANT
PAD DRESSING TELFA 3X8 NADH (GAUZE/BANDAGES/DRESSINGS) IMPLANT
SYR 20ML LL LF (SYRINGE) ×1 IMPLANT
SYR CONTROL 10ML LL (SYRINGE) ×1 IMPLANT
TUBE CONNECTING 12X1/4 (SUCTIONS) ×2 IMPLANT
WATER STERILE IRR 3000ML UROMA (IV SOLUTION) ×2 IMPLANT

## 2021-05-22 NOTE — Transfer of Care (Signed)
Immediate Anesthesia Transfer of Care Note  Patient: David Odom  Procedure(s) Performed: BOTOX INJECTION WITH CYSTOSCOPY (Bladder)  Patient Location: PACU  Anesthesia Type:General  Level of Consciousness: awake  Airway & Oxygen Therapy: Patient Spontanous Breathing and Patient connected to face mask oxygen  Post-op Assessment: Report given to RN and Post -op Vital signs reviewed and stable  Post vital signs: Reviewed and stable  Last Vitals:  Vitals Value Taken Time  BP 119/67 05/22/21 0913  Temp    Pulse 81 05/22/21 0915  Resp 15 05/22/21 0915  SpO2 100 % 05/22/21 0915  Vitals shown include unvalidated device data.  Last Pain:  Vitals:   05/22/21 0657  TempSrc: Oral         Complications: No notable events documented.

## 2021-05-22 NOTE — Anesthesia Procedure Notes (Signed)
Procedure Name: LMA Insertion Date/Time: 05/22/2021 8:35 AM Performed by: Caren Macadam, CRNA Pre-anesthesia Checklist: Patient identified, Emergency Drugs available, Suction available and Patient being monitored Patient Re-evaluated:Patient Re-evaluated prior to induction Oxygen Delivery Method: Circle system utilized Preoxygenation: Pre-oxygenation with 100% oxygen Induction Type: IV induction Ventilation: Mask ventilation without difficulty LMA: LMA inserted LMA Size: 5.0 Number of attempts: 1 Placement Confirmation: positive ETCO2 and breath sounds checked- equal and bilateral Tube secured with: Tape Dental Injury: Teeth and Oropharynx as per pre-operative assessment
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
## 2021-05-22 NOTE — Op Note (Signed)
Operative Note  Preoperative diagnosis:  1.  Neurogenic bladder 2.  History of spinal cord injury  Postoperative diagnosis: 1.  Neurogenic bladder 2.  History of spinal cord injury  Procedure(s): 1.  Cystoscopy with intravesical Botox injections (300 units)  Surgeon: Rhoderick Moody, MD  Assistants:  None  Anesthesia:  General  Complications:  None  EBL: 5 mL  Specimens: 1.  None  Drains/Catheters: 1.  None  Intraoperative findings:   No urethral or intravesical pathology was seen during cystoscopy  Indication:  David Odom is a 60 y.o. male with a history of spinal cord injury at the level of L1 resulting in a neurogenic bladder and incontinence.  He is here today for intravesical Botox injections to help with his incontinence.  He has been consented for the above procedures, voices understanding and wishes to proceed.  Description of procedure:  After informed consent was obtained, the patient was brought to the operating room and general LMA anesthesia was administered. The patient was then placed in the dorsolithotomy position and prepped and draped in the usual sterile fashion. A timeout was performed. A 23 French rigid cystoscope was then inserted into the urethral meatus and advanced into the bladder under direct vision. A complete bladder survey revealed no intravesical pathology.  A total of 300 units of Botox was then injected and 5 mL aliquots in a grid like fashion throughout the detrusor musculature.  There was no significant bleeding following the injections.  The patient's bladder was drained.  He tolerated the procedure well and was transferred to the postanesthesia in stable condition.  Plan: Resume clean intermittent catheterization every 4-6 hours.  Follow-up in 5 months to schedule his next Botox injection

## 2021-05-22 NOTE — Anesthesia Postprocedure Evaluation (Signed)
Anesthesia Post Note  Patient: David Odom  Procedure(s) Performed: BOTOX INJECTION WITH CYSTOSCOPY (Bladder)     Patient location during evaluation: PACU Anesthesia Type: General Level of consciousness: awake and alert Pain management: pain level controlled Vital Signs Assessment: post-procedure vital signs reviewed and stable Respiratory status: spontaneous breathing, nonlabored ventilation and respiratory function stable Cardiovascular status: blood pressure returned to baseline and stable Postop Assessment: no apparent nausea or vomiting Anesthetic complications: no   No notable events documented.  Last Vitals:  Vitals:   05/22/21 0945 05/22/21 1018  BP: 127/76 123/75  Pulse: 78 78  Resp: 11 14  Temp: 36.4 C   SpO2: 100% 99%    Last Pain:  Vitals:   05/22/21 1018  TempSrc:   PainSc: 0-No pain                 [name],W. EDMOND

## 2021-05-22 NOTE — Discharge Instructions (Addendum)
No acetaminophen/Tylenol until after 1:00pm today if needed for pain.    Post Anesthesia Home Care Instructions  Activity: Get plenty of rest for the remainder of the day. A responsible individual must stay with you for 24 hours following the procedure.  For the next 24 hours, DO NOT: -Drive a car -Advertising copywriter -Drink alcoholic beverages -Take any medication unless instructed by your physician -Make any legal decisions or sign important papers.  Meals: Start with liquid foods such as gelatin or soup. Progress to regular foods as tolerated. Avoid greasy, spicy, heavy foods. If nausea and/or vomiting occur, drink only clear liquids until the nausea and/or vomiting subsides. Call your physician if vomiting continues.  Special Instructions/Symptoms: Your throat may feel dry or sore from the anesthesia or the breathing tube placed in your throat during surgery. If this causes discomfort, gargle with warm salt water. The discomfort should disappear within 24 hours.

## 2021-05-23 ENCOUNTER — Encounter (HOSPITAL_BASED_OUTPATIENT_CLINIC_OR_DEPARTMENT_OTHER): Payer: Self-pay | Admitting: Urology

## 2021-05-27 DIAGNOSIS — R109 Unspecified abdominal pain: Secondary | ICD-10-CM | POA: Diagnosis not present

## 2021-05-27 DIAGNOSIS — R300 Dysuria: Secondary | ICD-10-CM | POA: Diagnosis not present

## 2021-05-27 DIAGNOSIS — R509 Fever, unspecified: Secondary | ICD-10-CM | POA: Diagnosis not present

## 2021-05-30 DIAGNOSIS — Z6826 Body mass index (BMI) 26.0-26.9, adult: Secondary | ICD-10-CM | POA: Diagnosis not present

## 2021-05-30 DIAGNOSIS — M5416 Radiculopathy, lumbar region: Secondary | ICD-10-CM | POA: Diagnosis not present

## 2021-05-30 DIAGNOSIS — R030 Elevated blood-pressure reading, without diagnosis of hypertension: Secondary | ICD-10-CM | POA: Diagnosis not present

## 2021-07-22 DIAGNOSIS — G8220 Paraplegia, unspecified: Secondary | ICD-10-CM | POA: Diagnosis not present

## 2021-07-22 DIAGNOSIS — M810 Age-related osteoporosis without current pathological fracture: Secondary | ICD-10-CM | POA: Diagnosis not present

## 2021-07-22 DIAGNOSIS — E559 Vitamin D deficiency, unspecified: Secondary | ICD-10-CM | POA: Diagnosis not present

## 2021-07-22 DIAGNOSIS — D509 Iron deficiency anemia, unspecified: Secondary | ICD-10-CM | POA: Diagnosis not present

## 2021-07-22 DIAGNOSIS — Z23 Encounter for immunization: Secondary | ICD-10-CM | POA: Diagnosis not present

## 2021-07-22 DIAGNOSIS — Z0000 Encounter for general adult medical examination without abnormal findings: Secondary | ICD-10-CM | POA: Diagnosis not present

## 2021-07-22 DIAGNOSIS — G4733 Obstructive sleep apnea (adult) (pediatric): Secondary | ICD-10-CM | POA: Diagnosis not present

## 2021-07-22 DIAGNOSIS — N319 Neuromuscular dysfunction of bladder, unspecified: Secondary | ICD-10-CM | POA: Diagnosis not present

## 2021-07-22 DIAGNOSIS — E1140 Type 2 diabetes mellitus with diabetic neuropathy, unspecified: Secondary | ICD-10-CM | POA: Diagnosis not present

## 2021-07-22 DIAGNOSIS — Z125 Encounter for screening for malignant neoplasm of prostate: Secondary | ICD-10-CM | POA: Diagnosis not present

## 2021-08-12 DIAGNOSIS — G4733 Obstructive sleep apnea (adult) (pediatric): Secondary | ICD-10-CM | POA: Diagnosis not present

## 2021-08-12 DIAGNOSIS — R030 Elevated blood-pressure reading, without diagnosis of hypertension: Secondary | ICD-10-CM | POA: Diagnosis not present

## 2021-09-06 DIAGNOSIS — E1140 Type 2 diabetes mellitus with diabetic neuropathy, unspecified: Secondary | ICD-10-CM | POA: Diagnosis not present

## 2021-09-06 DIAGNOSIS — E785 Hyperlipidemia, unspecified: Secondary | ICD-10-CM | POA: Diagnosis not present

## 2021-10-14 ENCOUNTER — Ambulatory Visit
Admission: RE | Admit: 2021-10-14 | Discharge: 2021-10-14 | Disposition: A | Discharge status: Home / Self Care | Payer: Medicare HMO | Source: Ambulatory Visit | Attending: Physician Assistant | Admitting: Physician Assistant

## 2021-10-14 ENCOUNTER — Other Ambulatory Visit: Payer: Self-pay | Admitting: Physician Assistant

## 2021-10-14 DIAGNOSIS — M7989 Other specified soft tissue disorders: Secondary | ICD-10-CM

## 2021-10-14 DIAGNOSIS — Z86718 Personal history of other venous thrombosis and embolism: Secondary | ICD-10-CM | POA: Diagnosis not present

## 2021-10-14 DIAGNOSIS — L03116 Cellulitis of left lower limb: Secondary | ICD-10-CM | POA: Diagnosis not present

## 2021-10-21 DIAGNOSIS — N3020 Other chronic cystitis without hematuria: Secondary | ICD-10-CM | POA: Diagnosis not present

## 2021-10-21 DIAGNOSIS — N3281 Overactive bladder: Secondary | ICD-10-CM | POA: Diagnosis not present

## 2021-10-21 DIAGNOSIS — L03116 Cellulitis of left lower limb: Secondary | ICD-10-CM | POA: Diagnosis not present

## 2021-10-21 DIAGNOSIS — N319 Neuromuscular dysfunction of bladder, unspecified: Secondary | ICD-10-CM | POA: Diagnosis not present

## 2021-10-23 ENCOUNTER — Other Ambulatory Visit: Payer: Self-pay | Admitting: Urology

## 2021-10-23 DIAGNOSIS — M25552 Pain in left hip: Secondary | ICD-10-CM | POA: Diagnosis not present

## 2021-10-23 DIAGNOSIS — G8220 Paraplegia, unspecified: Secondary | ICD-10-CM | POA: Diagnosis not present

## 2021-10-23 DIAGNOSIS — M5416 Radiculopathy, lumbar region: Secondary | ICD-10-CM | POA: Diagnosis not present

## 2021-10-23 DIAGNOSIS — M546 Pain in thoracic spine: Secondary | ICD-10-CM | POA: Diagnosis not present

## 2021-10-28 ENCOUNTER — Encounter (HOSPITAL_BASED_OUTPATIENT_CLINIC_OR_DEPARTMENT_OTHER): Payer: Self-pay | Admitting: Urology

## 2021-10-28 ENCOUNTER — Other Ambulatory Visit: Payer: Self-pay

## 2021-10-28 NOTE — Progress Notes (Signed)
Spoke w/ via phone for pre-op interview---pt ?Lab needs dos----  istat             ?Lab results------ current ekg in epic/ chart ?COVID test -----patient states asymptomatic no test needed ?Arrive at ------- 1245 on 10-30-2021 ?NPO after MN NO Solid Food.  Clear liquids from MN until--- 1145 ?Med rec completed ?Medications to take morning of surgery ----- methenamine ?Diabetic medication ----- n/a ?Patient instructed no nail polish to be worn day of surgery ?Patient instructed to bring photo id and insurance card day of surgery ?Patient aware to have Driver (ride ) / caregiver for 24 hours after surgery --- father, bill ?Patient Special Instructions ----- n/a ?Pre-Op special Istructions ----- ?Pt is paraplegic, wheelchair dependent transfer independently ?Patient verbalized understanding of instructions that were given at this phone interview. ?Patient denies shortness of breath, chest pain, fever, cough at this phone interview.  ?

## 2021-10-30 ENCOUNTER — Ambulatory Visit (HOSPITAL_BASED_OUTPATIENT_CLINIC_OR_DEPARTMENT_OTHER): Payer: Medicare HMO | Admitting: Anesthesiology

## 2021-10-30 ENCOUNTER — Ambulatory Visit (HOSPITAL_BASED_OUTPATIENT_CLINIC_OR_DEPARTMENT_OTHER)
Admission: RE | Admit: 2021-10-30 | Discharge: 2021-10-30 | Disposition: A | Discharge status: Home / Self Care | Payer: Medicare HMO | Attending: Urology | Admitting: Urology

## 2021-10-30 ENCOUNTER — Encounter (HOSPITAL_BASED_OUTPATIENT_CLINIC_OR_DEPARTMENT_OTHER): Payer: Self-pay | Admitting: Urology

## 2021-10-30 ENCOUNTER — Encounter (HOSPITAL_BASED_OUTPATIENT_CLINIC_OR_DEPARTMENT_OTHER)
Admission: RE | Disposition: A | Discharge status: Home / Self Care | Payer: Self-pay | Source: Home / Self Care | Attending: Urology

## 2021-10-30 DIAGNOSIS — N319 Neuromuscular dysfunction of bladder, unspecified: Secondary | ICD-10-CM | POA: Diagnosis not present

## 2021-10-30 DIAGNOSIS — E119 Type 2 diabetes mellitus without complications: Secondary | ICD-10-CM | POA: Diagnosis not present

## 2021-10-30 DIAGNOSIS — Z01818 Encounter for other preprocedural examination: Secondary | ICD-10-CM

## 2021-10-30 DIAGNOSIS — G4730 Sleep apnea, unspecified: Secondary | ICD-10-CM | POA: Diagnosis not present

## 2021-10-30 DIAGNOSIS — Z87828 Personal history of other (healed) physical injury and trauma: Secondary | ICD-10-CM | POA: Diagnosis not present

## 2021-10-30 DIAGNOSIS — G8220 Paraplegia, unspecified: Secondary | ICD-10-CM | POA: Insufficient documentation

## 2021-10-30 DIAGNOSIS — N318 Other neuromuscular dysfunction of bladder: Secondary | ICD-10-CM | POA: Diagnosis not present

## 2021-10-30 DIAGNOSIS — R32 Unspecified urinary incontinence: Secondary | ICD-10-CM | POA: Diagnosis not present

## 2021-10-30 DIAGNOSIS — I4510 Unspecified right bundle-branch block: Secondary | ICD-10-CM | POA: Insufficient documentation

## 2021-10-30 DIAGNOSIS — S34101D Unspecified injury to L1 level of lumbar spinal cord, subsequent encounter: Secondary | ICD-10-CM | POA: Diagnosis not present

## 2021-10-30 HISTORY — PX: BOTOX INJECTION: SHX5754

## 2021-10-30 LAB — GLUCOSE, CAPILLARY
Glucose-Capillary: 52 mg/dL — ABNORMAL LOW (ref 70–99)
Glucose-Capillary: 56 mg/dL — ABNORMAL LOW (ref 70–99)
Glucose-Capillary: 72 mg/dL (ref 70–99)

## 2021-10-30 LAB — POCT I-STAT, CHEM 8
BUN: 16 mg/dL (ref 8–23)
Calcium, Ion: 1.08 mmol/L — ABNORMAL LOW (ref 1.15–1.40)
Chloride: 105 mmol/L (ref 98–111)
Creatinine, Ser: 0.6 mg/dL — ABNORMAL LOW (ref 0.61–1.24)
Glucose, Bld: 84 mg/dL (ref 70–99)
HCT: 47 % (ref 39.0–52.0)
Hemoglobin: 16 g/dL (ref 13.0–17.0)
Potassium: 3.9 mmol/L (ref 3.5–5.1)
Sodium: 141 mmol/L (ref 135–145)
TCO2: 26 mmol/L (ref 22–32)

## 2021-10-30 SURGERY — BOTOX INJECTION
Anesthesia: Monitor Anesthesia Care | Site: Bladder

## 2021-10-30 MED ORDER — CEFAZOLIN SODIUM-DEXTROSE 2-4 GM/100ML-% IV SOLN
INTRAVENOUS | Status: AC
  Filled 2021-10-30: qty 100

## 2021-10-30 MED ORDER — OXYCODONE HCL 5 MG PO TABS: 5.0000 mg | ORAL_TABLET | Freq: Once | ORAL | Status: DC | PRN

## 2021-10-30 MED ORDER — STERILE WATER FOR IRRIGATION IR SOLN
Status: DC | PRN
Start: 2021-10-30 — End: 2021-10-30
  Administered 2021-10-30: 3000 mL

## 2021-10-30 MED ORDER — MIDAZOLAM HCL 2 MG/2ML IJ SOLN
INTRAMUSCULAR | Status: AC
Start: 2021-10-30 — End: ?
  Filled 2021-10-30: qty 2

## 2021-10-30 MED ORDER — OXYCODONE HCL 5 MG/5ML PO SOLN: 5.0000 mg | Freq: Once | ORAL | Status: DC | PRN

## 2021-10-30 MED ORDER — FENTANYL CITRATE (PF) 100 MCG/2ML IJ SOLN
INTRAMUSCULAR | Status: AC
  Filled 2021-10-30: qty 2

## 2021-10-30 MED ORDER — LACTATED RINGERS IV SOLN: INTRAVENOUS | Status: DC

## 2021-10-30 MED ORDER — CEFAZOLIN SODIUM-DEXTROSE 2-4 GM/100ML-% IV SOLN
2.0000 g | Freq: Once | INTRAVENOUS | Status: AC
  Administered 2021-10-30: 2 g via INTRAVENOUS

## 2021-10-30 MED ORDER — PROPOFOL 500 MG/50ML IV EMUL
INTRAVENOUS | Status: DC | PRN
  Administered 2021-10-30: 200 ug/kg/min via INTRAVENOUS

## 2021-10-30 MED ORDER — ONABOTULINUMTOXINA 100 UNITS IJ SOLR
INTRAMUSCULAR | Status: DC | PRN
Start: 2021-10-30 — End: 2021-10-30
  Administered 2021-10-30: 300 [IU]

## 2021-10-30 MED ORDER — MIDAZOLAM HCL 2 MG/2ML IJ SOLN
INTRAMUSCULAR | Status: DC | PRN
  Administered 2021-10-30 (×2): 1 mg via INTRAVENOUS

## 2021-10-30 MED ORDER — FENTANYL CITRATE (PF) 100 MCG/2ML IJ SOLN: 25.0000 ug | INTRAMUSCULAR | Status: DC | PRN

## 2021-10-30 MED ORDER — PROPOFOL 10 MG/ML IV BOLUS
INTRAVENOUS | Status: DC | PRN
  Administered 2021-10-30: 30 mg via INTRAVENOUS

## 2021-10-30 MED ORDER — PROPOFOL 1000 MG/100ML IV EMUL
INTRAVENOUS | Status: AC
  Filled 2021-10-30: qty 100

## 2021-10-30 MED ORDER — SODIUM CHLORIDE (PF) 0.9 % IJ SOLN
INTRAMUSCULAR | Status: DC | PRN
Start: 2021-10-30 — End: 2021-10-30
  Administered 2021-10-30: 30 mL

## 2021-10-30 SURGICAL SUPPLY — 19 items
BAG DRAIN URO-CYSTO SKYTR STRL (DRAIN) ×3 IMPLANT
BAG DRN UROCATH (DRAIN) ×1
CATH ROBINSON RED A/P 14FR (CATHETERS) IMPLANT
CLOTH BEACON ORANGE TIMEOUT ST (SAFETY) ×3 IMPLANT
ELECT REM PT RETURN 9FT ADLT (ELECTROSURGICAL) ×2
ELECTRODE REM PT RTRN 9FT ADLT (ELECTROSURGICAL) ×2 IMPLANT
GLOVE BIO SURGEON STRL SZ7.5 (GLOVE) ×3 IMPLANT
GOWN STRL REUS W/TWL LRG LVL3 (GOWN DISPOSABLE) ×9 IMPLANT
KIT TURNOVER CYSTO (KITS) ×3 IMPLANT
MANIFOLD NEPTUNE II (INSTRUMENTS) ×3 IMPLANT
NDL ASPIRATION 22 (NEEDLE) IMPLANT
NDL SAFETY ECLIPSE 18X1.5 (NEEDLE) IMPLANT
NEEDLE ASPIRATION 22 (NEEDLE) IMPLANT
NEEDLE HYPO 18GX1.5 SHARP (NEEDLE) ×2
PACK CYSTO (CUSTOM PROCEDURE TRAY) ×3 IMPLANT
SYR 20ML LL LF (SYRINGE) ×1 IMPLANT
SYR CONTROL 10ML LL (SYRINGE) IMPLANT
TUBE CONNECTING 12X1/4 (SUCTIONS) ×3 IMPLANT
WATER STERILE IRR 3000ML UROMA (IV SOLUTION) ×3 IMPLANT

## 2021-10-30 NOTE — Discharge Instructions (Signed)
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
## 2021-10-30 NOTE — Anesthesia Postprocedure Evaluation (Signed)
Anesthesia Post Note ? ?Patient: SHAIL URBAS ? ?Procedure(s) Performed: BOTOX INJECTION WITH CYSTOSCOPY, 300 UNITS (Bladder) ? ?  ? ?Patient location during evaluation: PACU ?Anesthesia Type: MAC ?Level of consciousness: awake and alert ?Pain management: pain level controlled ?Vital Signs Assessment: post-procedure vital signs reviewed and stable ?Respiratory status: spontaneous breathing, nonlabored ventilation and respiratory function stable ?Cardiovascular status: blood pressure returned to baseline ?Postop Assessment: no apparent nausea or vomiting ?Anesthetic complications: no ? ? ?No notable events documented. ? ?Last Vitals:  ?Vitals:  ? 10/30/21 1430 10/30/21 1445  ?BP: 140/75 136/69  ?Pulse: 81 81  ?Resp: 15 16  ?Temp: (!) 36.3 ?C   ?SpO2: 98% 99%  ?  ?Last Pain:  ?Vitals:  ? 10/30/21 1445  ?TempSrc:   ?PainSc: 0-No pain  ? ? ?  ?  ?  ?  ?  ?  ? ?Marthenia Rolling ? ? ? ? ?
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
## 2021-10-30 NOTE — Op Note (Signed)
Operative Note ? ?Preoperative diagnosis:  ?1.  Neurogenic bladder ?2.  History of spinal cord injury ? ?Postoperative diagnosis: ?1.  Neurogenic bladder ?2.  History of spinal cord injury ? ?Procedure(s): ?Cystoscopy with intravesical Botox injections (300 units) ? ?Surgeon: Ellison Hughs, MD ? ?Assistants:  None ? ?Anesthesia:  General ? ?Complications:  None ? ?EBL: Less than 5 mL ? ?Specimens: ?1.  None ? ?Drains/Catheters: ?1.  None ? ?Intraoperative findings:   ?No urethral or intravesical abnormalities were seen during cystoscopy ? ?Indication:  David Odom is a 61 y.o. male with a history of spinal cord injury at the level of L1 resulting in a neurogenic bladder and incontinence.  He is here today for intravesical Botox injections to help with his incontinence.  He has been consented for the above procedures, voices understanding and wishes to proceed. ? ?Description of procedure: ? ?After informed consent was obtained, the patient was brought to the operating room and general MAC anesthesia was administered. The patient was then placed in the dorsolithotomy position and prepped and draped in the usual sterile fashion. A timeout was performed. A 23 French rigid cystoscope was then inserted into the urethral meatus and advanced into the bladder under direct vision. A complete bladder survey revealed no intravesical pathology. ?  ?A total of 300 units of Botox was then injected and 5 mL aliquots in a grid like fashion throughout the detrusor musculature.  There was no significant bleeding following the injections.  The patient's bladder was drained.  He tolerated the procedure well and was transferred to the postanesthesia in stable condition. ? ?Plan:  Resume clean intermittent catheterization every 4-6 hours.  Follow-up in 5 months to schedule his next Botox injection ? ?

## 2021-10-30 NOTE — Transfer of Care (Signed)
Immediate Anesthesia Transfer of Care Note ? ?Patient: David Odom ? ?Procedure(s) Performed: Procedure(s) (LRB): ?BOTOX INJECTION WITH CYSTOSCOPY, 300 UNITS (N/A) ? ?Patient Location: PACU ? ?Anesthesia Type: MAC ? ?Level of Consciousness: awake, alert , oriented and patient cooperative ? ?Airway & Oxygen Therapy: Patient Spontanous Breathing and Patient connected to face mask oxygen ? ?Post-op Assessment: Report given to PACU RN and Post -op Vital signs reviewed and stable ? ?Post vital signs: Reviewed and stable ? ?Complications: No apparent anesthesia complications ? ?Last Vitals:  ?Vitals Value Taken Time  ?BP    ?Temp    ?Pulse 81 10/30/21 1429  ?Resp 15 10/30/21 1429  ?SpO2 84 % 10/30/21 1429  ?Vitals shown include unvalidated device data. ? ?Last Pain:  ?Vitals:  ? 10/30/21 1232  ?TempSrc: Oral  ?PainSc: 0-No pain  ?   ? ?Patients Stated Pain Goal: 4 (10/30/21 1232) ? ?Complications: No notable events documented. ?

## 2021-10-31 ENCOUNTER — Encounter (HOSPITAL_BASED_OUTPATIENT_CLINIC_OR_DEPARTMENT_OTHER): Payer: Self-pay | Admitting: Urology

## 2021-10-31 LAB — GLUCOSE, CAPILLARY: Glucose-Capillary: 49 mg/dL — ABNORMAL LOW (ref 70–99)

## 2021-11-04 DIAGNOSIS — E119 Type 2 diabetes mellitus without complications: Secondary | ICD-10-CM | POA: Diagnosis not present

## 2021-11-04 DIAGNOSIS — G4733 Obstructive sleep apnea (adult) (pediatric): Secondary | ICD-10-CM | POA: Diagnosis not present

## 2021-11-14 DIAGNOSIS — M25552 Pain in left hip: Secondary | ICD-10-CM | POA: Diagnosis not present

## 2021-11-25 DIAGNOSIS — H6692 Otitis media, unspecified, left ear: Secondary | ICD-10-CM | POA: Diagnosis not present

## 2021-12-18 DIAGNOSIS — M25552 Pain in left hip: Secondary | ICD-10-CM | POA: Diagnosis not present

## 2021-12-18 DIAGNOSIS — M5416 Radiculopathy, lumbar region: Secondary | ICD-10-CM | POA: Diagnosis not present

## 2021-12-18 DIAGNOSIS — Z6826 Body mass index (BMI) 26.0-26.9, adult: Secondary | ICD-10-CM | POA: Diagnosis not present

## 2022-01-09 DIAGNOSIS — I8310 Varicose veins of unspecified lower extremity with inflammation: Secondary | ICD-10-CM | POA: Diagnosis not present

## 2022-01-09 DIAGNOSIS — L0390 Cellulitis, unspecified: Secondary | ICD-10-CM | POA: Diagnosis not present

## 2022-01-22 DIAGNOSIS — G4733 Obstructive sleep apnea (adult) (pediatric): Secondary | ICD-10-CM | POA: Diagnosis not present

## 2022-01-22 DIAGNOSIS — I8310 Varicose veins of unspecified lower extremity with inflammation: Secondary | ICD-10-CM | POA: Diagnosis not present

## 2022-01-22 DIAGNOSIS — E1140 Type 2 diabetes mellitus with diabetic neuropathy, unspecified: Secondary | ICD-10-CM | POA: Diagnosis not present

## 2022-01-22 DIAGNOSIS — N319 Neuromuscular dysfunction of bladder, unspecified: Secondary | ICD-10-CM | POA: Diagnosis not present

## 2022-01-27 DIAGNOSIS — G8220 Paraplegia, unspecified: Secondary | ICD-10-CM | POA: Diagnosis not present

## 2022-01-27 DIAGNOSIS — L89159 Pressure ulcer of sacral region, unspecified stage: Secondary | ICD-10-CM | POA: Diagnosis not present

## 2022-02-05 ENCOUNTER — Ambulatory Visit: Payer: Medicare HMO | Admitting: Vascular Surgery

## 2022-02-05 ENCOUNTER — Encounter: Payer: Self-pay | Admitting: Vascular Surgery

## 2022-02-05 VITALS — BP 144/82 | HR 95 | Temp 98.7°F | Resp 16 | Ht 71.0 in | Wt 204.0 lb

## 2022-02-05 DIAGNOSIS — I872 Venous insufficiency (chronic) (peripheral): Secondary | ICD-10-CM
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
## 2022-02-14 ENCOUNTER — Other Ambulatory Visit: Payer: Self-pay | Admitting: Neurological Surgery

## 2022-02-14 DIAGNOSIS — M5416 Radiculopathy, lumbar region: Secondary | ICD-10-CM

## 2022-02-27 DIAGNOSIS — L89159 Pressure ulcer of sacral region, unspecified stage: Secondary | ICD-10-CM | POA: Diagnosis not present

## 2022-02-27 DIAGNOSIS — G8220 Paraplegia, unspecified: Secondary | ICD-10-CM | POA: Diagnosis not present

## 2022-03-04 DIAGNOSIS — R339 Retention of urine, unspecified: Secondary | ICD-10-CM | POA: Diagnosis not present

## 2022-03-04 DIAGNOSIS — N319 Neuromuscular dysfunction of bladder, unspecified: Secondary | ICD-10-CM | POA: Diagnosis not present

## 2022-03-29 DIAGNOSIS — L89159 Pressure ulcer of sacral region, unspecified stage: Secondary | ICD-10-CM | POA: Diagnosis not present

## 2022-03-29 DIAGNOSIS — G8220 Paraplegia, unspecified: Secondary | ICD-10-CM | POA: Diagnosis not present

## 2022-04-16 DIAGNOSIS — H2513 Age-related nuclear cataract, bilateral: Secondary | ICD-10-CM | POA: Diagnosis not present

## 2022-04-16 DIAGNOSIS — E119 Type 2 diabetes mellitus without complications: Secondary | ICD-10-CM | POA: Diagnosis not present

## 2022-04-24 DIAGNOSIS — N319 Neuromuscular dysfunction of bladder, unspecified: Secondary | ICD-10-CM | POA: Diagnosis not present

## 2022-04-29 DIAGNOSIS — L89159 Pressure ulcer of sacral region, unspecified stage: Secondary | ICD-10-CM | POA: Diagnosis not present

## 2022-04-29 DIAGNOSIS — G8220 Paraplegia, unspecified: Secondary | ICD-10-CM | POA: Diagnosis not present

## 2022-05-13 ENCOUNTER — Other Ambulatory Visit: Payer: Self-pay | Admitting: Urology

## 2022-05-13 DIAGNOSIS — E119 Type 2 diabetes mellitus without complications: Secondary | ICD-10-CM | POA: Diagnosis not present

## 2022-05-13 DIAGNOSIS — M810 Age-related osteoporosis without current pathological fracture: Secondary | ICD-10-CM | POA: Diagnosis not present

## 2022-05-13 DIAGNOSIS — E785 Hyperlipidemia, unspecified: Secondary | ICD-10-CM | POA: Diagnosis not present

## 2022-05-21 ENCOUNTER — Encounter (HOSPITAL_BASED_OUTPATIENT_CLINIC_OR_DEPARTMENT_OTHER): Payer: Self-pay | Admitting: Urology
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
## 2022-05-23 ENCOUNTER — Encounter (HOSPITAL_BASED_OUTPATIENT_CLINIC_OR_DEPARTMENT_OTHER): Payer: Self-pay | Admitting: Urology

## 2022-05-23 ENCOUNTER — Ambulatory Visit (HOSPITAL_BASED_OUTPATIENT_CLINIC_OR_DEPARTMENT_OTHER): Payer: Medicare HMO | Admitting: Anesthesiology

## 2022-05-23 ENCOUNTER — Ambulatory Visit (HOSPITAL_BASED_OUTPATIENT_CLINIC_OR_DEPARTMENT_OTHER)
Admission: RE | Admit: 2022-05-23 | Discharge: 2022-05-23 | Disposition: A | Discharge status: Home / Self Care | Payer: Medicare HMO | Attending: Urology | Admitting: Urology

## 2022-05-23 ENCOUNTER — Other Ambulatory Visit: Payer: Self-pay

## 2022-05-23 ENCOUNTER — Encounter (HOSPITAL_BASED_OUTPATIENT_CLINIC_OR_DEPARTMENT_OTHER)
Admission: RE | Disposition: A | Discharge status: Home / Self Care | Payer: Self-pay | Source: Home / Self Care | Attending: Urology

## 2022-05-23 DIAGNOSIS — N3941 Urge incontinence: Secondary | ICD-10-CM | POA: Insufficient documentation

## 2022-05-23 DIAGNOSIS — Z87440 Personal history of urinary (tract) infections: Secondary | ICD-10-CM | POA: Insufficient documentation

## 2022-05-23 DIAGNOSIS — S34101S Unspecified injury to L1 level of lumbar spinal cord, sequela: Secondary | ICD-10-CM | POA: Diagnosis not present

## 2022-05-23 DIAGNOSIS — G4730 Sleep apnea, unspecified: Secondary | ICD-10-CM

## 2022-05-23 DIAGNOSIS — E119 Type 2 diabetes mellitus without complications: Secondary | ICD-10-CM

## 2022-05-23 DIAGNOSIS — N319 Neuromuscular dysfunction of bladder, unspecified: Secondary | ICD-10-CM | POA: Diagnosis not present

## 2022-05-23 DIAGNOSIS — Z87898 Personal history of other specified conditions: Secondary | ICD-10-CM | POA: Diagnosis not present

## 2022-05-23 DIAGNOSIS — G8220 Paraplegia, unspecified: Secondary | ICD-10-CM | POA: Diagnosis not present

## 2022-05-23 DIAGNOSIS — G4733 Obstructive sleep apnea (adult) (pediatric): Secondary | ICD-10-CM | POA: Diagnosis not present

## 2022-05-23 DIAGNOSIS — Z87828 Personal history of other (healed) physical injury and trauma: Secondary | ICD-10-CM | POA: Diagnosis not present

## 2022-05-23 DIAGNOSIS — Z01818 Encounter for other preprocedural examination: Secondary | ICD-10-CM

## 2022-05-23 HISTORY — PX: CYSTOSCOPY: SHX5120

## 2022-05-23 HISTORY — DX: Other specified health status: Z78.9

## 2022-05-23 HISTORY — PX: BOTOX INJECTION: SHX5754

## 2022-05-23 HISTORY — DX: Dependence on wheelchair: Z99.3

## 2022-05-23 HISTORY — DX: Lymphedema, not elsewhere classified: I89.0

## 2022-05-23 SURGERY — CYSTOSCOPY
Anesthesia: Monitor Anesthesia Care | Site: Ureter

## 2022-05-23 MED ORDER — PROPOFOL 500 MG/50ML IV EMUL
INTRAVENOUS | Status: AC
  Filled 2022-05-23: qty 50

## 2022-05-23 MED ORDER — SODIUM CHLORIDE (PF) 0.9 % IJ SOLN
INTRAMUSCULAR | Status: DC | PRN
  Administered 2022-05-23: 300 mL

## 2022-05-23 MED ORDER — ACETAMINOPHEN 500 MG PO TABS
1000.0000 mg | ORAL_TABLET | Freq: Once | ORAL | Status: AC
  Administered 2022-05-23: 1000 mg via ORAL

## 2022-05-23 MED ORDER — FENTANYL CITRATE (PF) 100 MCG/2ML IJ SOLN: 25.0000 ug | INTRAMUSCULAR | Status: DC | PRN

## 2022-05-23 MED ORDER — SODIUM CHLORIDE 0.9 % IR SOLN
Status: DC | PRN
  Administered 2022-05-23: 3000 mL

## 2022-05-23 MED ORDER — CEFAZOLIN SODIUM-DEXTROSE 2-4 GM/100ML-% IV SOLN
2.0000 g | INTRAVENOUS | Status: AC
  Administered 2022-05-23: 2 g via INTRAVENOUS

## 2022-05-23 MED ORDER — PROPOFOL 500 MG/50ML IV EMUL
INTRAVENOUS | Status: DC | PRN
  Administered 2022-05-23: 200 ug/kg/min via INTRAVENOUS

## 2022-05-23 MED ORDER — OXYCODONE HCL 5 MG/5ML PO SOLN: 5.0000 mg | Freq: Once | ORAL | Status: DC | PRN

## 2022-05-23 MED ORDER — FENTANYL CITRATE (PF) 100 MCG/2ML IJ SOLN
INTRAMUSCULAR | Status: DC | PRN
  Administered 2022-05-23: 25 ug via INTRAVENOUS

## 2022-05-23 MED ORDER — LACTATED RINGERS IV SOLN: INTRAVENOUS | Status: DC

## 2022-05-23 MED ORDER — OXYCODONE HCL 5 MG PO TABS: 5.0000 mg | ORAL_TABLET | Freq: Once | ORAL | Status: DC | PRN

## 2022-05-23 MED ORDER — CEFAZOLIN SODIUM-DEXTROSE 2-4 GM/100ML-% IV SOLN
INTRAVENOUS | Status: AC
  Filled 2022-05-23: qty 100

## 2022-05-23 MED ORDER — PROPOFOL 10 MG/ML IV BOLUS
INTRAVENOUS | Status: AC
  Filled 2022-05-23: qty 20

## 2022-05-23 MED ORDER — FENTANYL CITRATE (PF) 100 MCG/2ML IJ SOLN
INTRAMUSCULAR | Status: AC
  Filled 2022-05-23: qty 2

## 2022-05-23 MED ORDER — MIDAZOLAM HCL 2 MG/2ML IJ SOLN
INTRAMUSCULAR | Status: DC | PRN
  Administered 2022-05-23: 1 mg via INTRAVENOUS

## 2022-05-23 MED ORDER — ONABOTULINUMTOXINA 100 UNITS IJ SOLR
INTRAMUSCULAR | Status: DC | PRN
  Administered 2022-05-23: 300 [IU] via INTRAMUSCULAR

## 2022-05-23 MED ORDER — ACETAMINOPHEN 500 MG PO TABS
ORAL_TABLET | ORAL | Status: AC
  Filled 2022-05-23: qty 2

## 2022-05-23 MED ORDER — LIDOCAINE 2% (20 MG/ML) 5 ML SYRINGE
INTRAMUSCULAR | Status: DC | PRN
  Administered 2022-05-23: 60 mg via INTRAVENOUS

## 2022-05-23 MED ORDER — PROPOFOL 10 MG/ML IV BOLUS
INTRAVENOUS | Status: DC | PRN
  Administered 2022-05-23: 30 mg via INTRAVENOUS

## 2022-05-23 MED ORDER — MIDAZOLAM HCL 2 MG/2ML IJ SOLN
INTRAMUSCULAR | Status: AC
  Filled 2022-05-23: qty 2

## 2022-05-23 SURGICAL SUPPLY — 23 items
BAG DRAIN URO-CYSTO SKYTR STRL (DRAIN) ×3 IMPLANT
BAG DRN UROCATH (DRAIN) ×2
CATH ROBINSON RED A/P 14FR (CATHETERS) IMPLANT
CLOTH BEACON ORANGE TIMEOUT ST (SAFETY) ×3 IMPLANT
ELECT REM PT RETURN 9FT ADLT (ELECTROSURGICAL) ×2
ELECTRODE REM PT RTRN 9FT ADLT (ELECTROSURGICAL) ×3 IMPLANT
GLOVE BIO SURGEON STRL SZ7.5 (GLOVE) ×3 IMPLANT
GLOVE BIOGEL PI IND STRL 6.5 (GLOVE) IMPLANT
GLOVE ECLIPSE 6.5 STRL STRAW (GLOVE) IMPLANT
GOWN STRL REUS W/TWL LRG LVL3 (GOWN DISPOSABLE) ×9 IMPLANT
IV NS IRRIG 3000ML ARTHROMATIC (IV SOLUTION) IMPLANT
KIT TURNOVER CYSTO (KITS) ×3 IMPLANT
MANIFOLD NEPTUNE II (INSTRUMENTS) ×3 IMPLANT
NDL ASPIRATION 22 (NEEDLE) IMPLANT
NDL FILTER BLUNT 18X1 1/2 (NEEDLE) IMPLANT
NDL SAFETY ECLIP 18X1.5 (MISCELLANEOUS) IMPLANT
NEEDLE ASPIRATION 22 (NEEDLE) ×2 IMPLANT
NEEDLE FILTER BLUNT 18X1 1/2 (NEEDLE) ×6 IMPLANT
PACK CYSTO (CUSTOM PROCEDURE TRAY) ×3 IMPLANT
SYR 10ML LL (SYRINGE) IMPLANT
SYR 20ML LL LF (SYRINGE) IMPLANT
SYR CONTROL 10ML LL (SYRINGE) IMPLANT
TUBE CONNECTING 12X1/4 (SUCTIONS) ×3 IMPLANT

## 2022-05-23 NOTE — Transfer of Care (Signed)
Immediate Anesthesia Transfer of Care Note  Patient: David Odom  Procedure(s) Performed: Procedure(s) (LRB): CYSTOSCOPY (N/A) BOTOX INJECTION 300 UNITS (N/A)  Patient Location: PACU  Anesthesia Type: MAC  Level of Consciousness: awake, alert , oriented and patient cooperative  Airway & Oxygen Therapy: Patient Spontanous Breathing and Patient connected to face mask oxygen  Post-op Assessment: Report given to PACU RN and Post -op Vital signs reviewed and stable  Post vital signs: Reviewed and stable  Complications: No apparent anesthesia complications  Last Vitals:  Vitals Value Taken Time  BP 117/76 05/23/22 1423  Temp    Pulse 79 05/23/22 1423  Resp 17 05/23/22 1424  SpO2    Vitals shown include unvalidated device data.  Last Pain:  Vitals:   05/23/22 1153  TempSrc: Oral  PainSc: 0-No pain      Patients Stated Pain Goal: 5 (09/40/76 8088)  Complications: No notable events documented.

## 2022-05-23 NOTE — Discharge Instructions (Addendum)
°  Post Anesthesia Home Care Instructions ° °Activity: °Get plenty of rest for the remainder of the day. A responsible individual must stay with you for 24 hours following the procedure.  °For the next 24 hours, DO NOT: °-Drive a car °-Operate machinery °-Drink alcoholic beverages °-Take any medication unless instructed by your physician °-Make any legal decisions or sign important papers. ° °Meals: °Start with liquid foods such as gelatin or soup. Progress to regular foods as tolerated. Avoid greasy, spicy, heavy foods. If nausea and/or vomiting occur, drink only clear liquids until the nausea and/or vomiting subsides. Call your physician if vomiting continues. ° °Special Instructions/Symptoms: °Your throat may feel dry or sore from the anesthesia or the breathing tube placed in your throat during surgery. If this causes discomfort, gargle with warm salt water. The discomfort should disappear within 24 hours. ° °   °Call your surgeon if you experience:  ° °1.  Fever over 101.0. °2.  Inability to urinate. °3.  Nausea and/or vomiting. °4.  Extreme swelling or bruising at the surgical site. °5.  Continued bleeding from the incision. °6.  Increased pain, redness or drainage from the incision. °7.  Problems related to your pain medication. °8.  Any problems and/or concerns °
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
## 2022-05-23 NOTE — Anesthesia Postprocedure Evaluation (Signed)
Anesthesia Post Note  Patient: David Odom  Procedure(s) Performed: CYSTOSCOPY ([place]) BOTOX INJECTION 300 UNITS (Bladder)     Patient location during evaluation: PACU Anesthesia Type: MAC Level of consciousness: awake and alert Pain management: pain level controlled Vital Signs Assessment: post-procedure vital signs reviewed and stable Respiratory status: spontaneous breathing, nonlabored ventilation and respiratory function stable Cardiovascular status: blood pressure returned to baseline Postop Assessment: no apparent nausea or vomiting Anesthetic complications: no   No notable events documented.  Last Vitals:  Vitals:   05/23/22 1423 05/23/22 1430  BP: 117/76 129/80  Pulse: 79 78  Resp: 17 13  Temp: 36.7 C   SpO2: 92% 98%    Last Pain:  Vitals:   05/23/22 1423  TempSrc:   PainSc: 0-No pain                 Marthenia Rolling
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
## 2022-05-23 NOTE — Anesthesia Procedure Notes (Signed)
Procedure Name: MAC Date/Time: 05/23/2022 1:52 PM  Performed by: Suan Halter, CRNAPre-anesthesia Checklist: Patient identified, Emergency Drugs available, Suction available, Patient being monitored and Timeout performed Patient Re-evaluated:Patient Re-evaluated prior to induction Oxygen Delivery Method: Simple face mask

## 2022-05-26 ENCOUNTER — Encounter (HOSPITAL_BASED_OUTPATIENT_CLINIC_OR_DEPARTMENT_OTHER): Payer: Self-pay | Admitting: Urology

## 2022-05-26 LAB — POCT I-STAT, CHEM 8
BUN: 15 mg/dL (ref 8–23)
Calcium, Ion: 1.12 mmol/L — ABNORMAL LOW (ref 1.15–1.40)
Chloride: 106 mmol/L (ref 98–111)
Creatinine, Ser: 0.5 mg/dL — ABNORMAL LOW (ref 0.61–1.24)
Glucose, Bld: 84 mg/dL (ref 70–99)
HCT: 49 % (ref 39.0–52.0)
Hemoglobin: 16.7 g/dL (ref 13.0–17.0)
Potassium: 4.5 mmol/L (ref 3.5–5.1)
Sodium: 141 mmol/L (ref 135–145)
TCO2: 25 mmol/L (ref 22–32)

## 2022-05-27 DIAGNOSIS — N319 Neuromuscular dysfunction of bladder, unspecified: Secondary | ICD-10-CM | POA: Diagnosis not present

## 2022-05-27 DIAGNOSIS — R339 Retention of urine, unspecified: Secondary | ICD-10-CM | POA: Diagnosis not present

## 2022-05-29 DIAGNOSIS — G8220 Paraplegia, unspecified: Secondary | ICD-10-CM | POA: Diagnosis not present

## 2022-05-29 DIAGNOSIS — L89159 Pressure ulcer of sacral region, unspecified stage: Secondary | ICD-10-CM | POA: Diagnosis not present

## 2022-06-29 DIAGNOSIS — L89159 Pressure ulcer of sacral region, unspecified stage: Secondary | ICD-10-CM | POA: Diagnosis not present

## 2022-06-29 DIAGNOSIS — G8220 Paraplegia, unspecified: Secondary | ICD-10-CM | POA: Diagnosis not present

## 2022-07-29 DIAGNOSIS — G4733 Obstructive sleep apnea (adult) (pediatric): Secondary | ICD-10-CM | POA: Diagnosis not present

## 2022-07-29 DIAGNOSIS — N319 Neuromuscular dysfunction of bladder, unspecified: Secondary | ICD-10-CM | POA: Diagnosis not present

## 2022-07-29 DIAGNOSIS — E1140 Type 2 diabetes mellitus with diabetic neuropathy, unspecified: Secondary | ICD-10-CM | POA: Diagnosis not present

## 2022-07-29 DIAGNOSIS — G8220 Paraplegia, unspecified: Secondary | ICD-10-CM | POA: Diagnosis not present

## 2022-07-29 DIAGNOSIS — R972 Elevated prostate specific antigen [PSA]: Secondary | ICD-10-CM | POA: Diagnosis not present

## 2022-07-29 DIAGNOSIS — Z1331 Encounter for screening for depression: Secondary | ICD-10-CM | POA: Diagnosis not present

## 2022-07-29 DIAGNOSIS — L089 Local infection of the skin and subcutaneous tissue, unspecified: Secondary | ICD-10-CM | POA: Diagnosis not present

## 2022-07-29 DIAGNOSIS — E559 Vitamin D deficiency, unspecified: Secondary | ICD-10-CM | POA: Diagnosis not present

## 2022-07-29 DIAGNOSIS — E785 Hyperlipidemia, unspecified: Secondary | ICD-10-CM | POA: Diagnosis not present

## 2022-07-29 DIAGNOSIS — H6090 Unspecified otitis externa, unspecified ear: Secondary | ICD-10-CM | POA: Diagnosis not present

## 2022-07-29 DIAGNOSIS — M5136 Other intervertebral disc degeneration, lumbar region: Secondary | ICD-10-CM | POA: Diagnosis not present

## 2022-07-29 DIAGNOSIS — Z0000 Encounter for general adult medical examination without abnormal findings: Secondary | ICD-10-CM | POA: Diagnosis not present

## 2022-07-30 DIAGNOSIS — L89159 Pressure ulcer of sacral region, unspecified stage: Secondary | ICD-10-CM | POA: Diagnosis not present

## 2022-07-30 DIAGNOSIS — G8220 Paraplegia, unspecified: Secondary | ICD-10-CM | POA: Diagnosis not present

## 2022-08-01 ENCOUNTER — Ambulatory Visit (INDEPENDENT_AMBULATORY_CARE_PROVIDER_SITE_OTHER): Payer: Medicare HMO

## 2022-08-01 ENCOUNTER — Ambulatory Visit: Payer: Medicare HMO | Admitting: Podiatry

## 2022-08-01 ENCOUNTER — Other Ambulatory Visit: Payer: Self-pay | Admitting: Podiatry

## 2022-08-01 DIAGNOSIS — B351 Tinea unguium: Secondary | ICD-10-CM

## 2022-08-01 DIAGNOSIS — Z79899 Other long term (current) drug therapy: Secondary | ICD-10-CM

## 2022-08-01 DIAGNOSIS — M79674 Pain in right toe(s): Secondary | ICD-10-CM | POA: Diagnosis not present

## 2022-08-01 DIAGNOSIS — L97511 Non-pressure chronic ulcer of other part of right foot limited to breakdown of skin: Secondary | ICD-10-CM | POA: Diagnosis not present

## 2022-08-01 DIAGNOSIS — B353 Tinea pedis: Secondary | ICD-10-CM

## 2022-08-01 MED ORDER — KETOCONAZOLE 2 % EX CREA: 1.0000 | TOPICAL_CREAM | Freq: Every day | CUTANEOUS | 0 refills | Status: AC

## 2022-08-01 NOTE — Patient Instructions (Signed)
Clean the ulcers with saline, dry well. Apply maxorb then a bandage.   Use ketoconazole (antifungal) between the other toes. Do not apply to open wounds  Monitor for any signs/symptoms of infection. Call the office immediately if any occur or go directly to the emergency room. Call with any questions/concerns.  Terbinafine Tablets What is this medication? TERBINAFINE (TER bin a feen) treats fungal infections of the nails. It belongs to a group of medications called antifungals. It will not treat infections caused by bacteria or viruses. This medicine may be used for other purposes; ask your health care provider or pharmacist if you have questions. COMMON BRAND NAME(S): Lamisil, Terbinex What should I tell my care team before I take this medication? They need to know if you have any of these conditions: Liver disease An unusual or allergic reaction to terbinafine, other medications, foods, dyes, or preservatives Pregnant or trying to get pregnant Breast-feeding How should I use this medication? Take this medication by mouth with water. Take it as directed on the prescription label at the same time every day. You can take it with or without food. If it upsets your stomach, take it with food. Keep taking it unless your care team tells you to stop. A special MedGuide will be given to you by the pharmacist with each prescription and refill. Be sure to read this information carefully each time. Talk to your care team regarding the use of this medication in children. Special care may be needed. Overdosage: If you think you have taken too much of this medicine contact a poison control center or emergency room at once. NOTE: This medicine is only for you. Do not share this medicine with others. What if I miss a dose? If you miss a dose, take it as soon as you can unless it is more than 4 hours late. If it is more than 4 hours late, skip the missed dose. Take the next dose at the normal time. What may  interact with this medication? Do not take this medication with any of the following: Pimozide Thioridazine This medication may also interact with the following: Beta blockers Caffeine Certain medications for mental health conditions Cimetidine Cyclosporine Medications for fungal infections like fluconazole and ketoconazole Medications for irregular heartbeat like amiodarone, flecainide and propafenone Rifampin Warfarin This list may not describe all possible interactions. Give your health care provider a list of all the medicines, herbs, non-prescription drugs, or dietary supplements you use. Also tell them if you smoke, drink alcohol, or use illegal drugs. Some items may interact with your medicine. What should I watch for while using this medication? Visit your care team for regular checks on your progress. You may need blood work while you are taking this medication. It may be some time before you see the benefit from this medication. This medication may cause serious skin reactions. They can happen weeks to months after starting the medication. Contact your care team right away if you notice fevers or flu-like symptoms with a rash. The rash may be red or purple and then turn into blisters or peeling of the skin. Or, you might notice a red rash with swelling of the face, lips or lymph nodes in your neck or under your arms. This medication can make you more sensitive to the sun. Keep out of the sun, If you cannot avoid being in the sun, wear protective clothing and sunscreen. Do not use sun lamps or tanning beds/booths. What side effects may I notice from  receiving this medication? Side effects that you should report to your care team as soon as possible: Allergic reactions--skin rash, itching, hives, swelling of the face, lips, tongue, or throat Change in sense of smell Change in taste Infection--fever, chills, cough, or sore throat Liver injury--right upper belly pain, loss of appetite,  nausea, light-colored stool, dark yellow or brown urine, yellowing skin or eyes, unusual weakness or fatigue Low red blood cell level--unusual weakness or fatigue, dizziness, headache, trouble breathing Lupus-like syndrome--joint pain, swelling, or stiffness, butterfly-shaped rash on the face, rashes that get worse in the sun, fever, unusual weakness or fatigue Rash, fever, and swollen lymph nodes Redness, blistering, peeling, or loosening of the skin, including inside the mouth Unusual bruising or bleeding Worsening mood, feelings of depression Side effects that usually do not require medical attention (report to your care team if they continue or are bothersome): Diarrhea Gas Headache Nausea Stomach pain Upset stomach This list may not describe all possible side effects. Call your doctor for medical advice about side effects. You may report side effects to FDA at 1-800-FDA-1088. Where should I keep my medication? Keep out of the reach of children and pets. Store between 20 and 25 degrees C (68 and 77 degrees F). Protect from light. Get rid of any unused medication after the expiration date. To get rid of medications that are no longer needed or have expired: Take the medication to a medication take-back program. Check with your pharmacy or law enforcement to find a location. If you cannot return the medication, check the label or package insert to see if the medication should be thrown out in the garbage or flushed down the toilet. If you are not sure, ask your care team. If it is safe to put it in the trash, take the medication out of the container. Mix the medication with cat litter, dirt, coffee grounds, or other unwanted substance. Seal the mixture in a bag or container. Put it in the trash. NOTE: This sheet is a summary. It may not cover all possible information. If you have questions about this medicine, talk to your doctor, pharmacist, or health care provider.  2023 Elsevier/Gold  Standard (2020-12-25 00:00:00)

## 2022-08-01 NOTE — Progress Notes (Unsigned)
Subjective:   Patient ID: David Odom, male   DOB: 62 y.o.   MRN: [id number]   HPI Chief Complaint  Patient presents with   Toe Pain    Pt stated that he has dry skin and a place popped up between the 3rd and 4th toe     This started about 1 week ago. He has some clear drainage. He has been using some OTC creams and keeping it clean with antispecitc spray daily. His PCP prescribed doxycline. He has been on it for a couple of day.   He has tried multiple OTC treatments without improvement.   No recent injury. No fevers or chills.  He is diabetic, controlled by diet.    ROS      Objective:  Physical Exam  ***     Assessment:  ***     Plan:  ***

## 2022-08-02 LAB — CBC WITH DIFFERENTIAL/PLATELET
Basophils Absolute: 0.1 10*3/uL (ref 0.0–0.2)
Basos: 1 %
EOS (ABSOLUTE): 0.1 10*3/uL (ref 0.0–0.4)
Eos: 2 %
Hematocrit: 45.6 % (ref 37.5–51.0)
Hemoglobin: 15 g/dL (ref 13.0–17.7)
Immature Grans (Abs): 0 10*3/uL (ref 0.0–0.1)
Immature Granulocytes: 1 %
Lymphocytes Absolute: 1.5 10*3/uL (ref 0.7–3.1)
Lymphs: 22 %
MCH: 30.1 pg (ref 26.6–33.0)
MCHC: 32.9 g/dL (ref 31.5–35.7)
MCV: 91 fL (ref 79–97)
Monocytes Absolute: 0.6 10*3/uL (ref 0.1–0.9)
Monocytes: 9 %
Neutrophils Absolute: 4.5 10*3/uL (ref 1.4–7.0)
Neutrophils: 65 %
Platelets: 322 10*3/uL (ref 150–450)
RBC: 4.99 x10E6/uL (ref 4.14–5.80)
RDW: 13 % (ref 11.6–15.4)
WBC: 6.8 10*3/uL (ref 3.4–10.8)

## 2022-08-02 LAB — HEPATIC FUNCTION PANEL
ALT: 22 IU/L (ref 0–44)
AST: 17 IU/L (ref 0–40)
Albumin: 3.9 g/dL (ref 3.9–4.9)
Alkaline Phosphatase: 80 IU/L (ref 44–121)
Bilirubin Total: 0.2 mg/dL (ref 0.0–1.2)
Bilirubin, Direct: <0.1 mg/dL (ref 0.00–0.40)
Total Protein: 7 g/dL (ref 6.0–8.5)

## 2022-08-05 DIAGNOSIS — L570 Actinic keratosis: Secondary | ICD-10-CM | POA: Diagnosis not present

## 2022-08-05 DIAGNOSIS — I872 Venous insufficiency (chronic) (peripheral): Secondary | ICD-10-CM | POA: Diagnosis not present

## 2022-08-05 DIAGNOSIS — D2261 Melanocytic nevi of right upper limb, including shoulder: Secondary | ICD-10-CM | POA: Diagnosis not present

## 2022-08-05 DIAGNOSIS — L821 Other seborrheic keratosis: Secondary | ICD-10-CM | POA: Diagnosis not present

## 2022-08-05 DIAGNOSIS — T148XXA Other injury of unspecified body region, initial encounter: Secondary | ICD-10-CM | POA: Diagnosis not present

## 2022-08-05 DIAGNOSIS — D2262 Melanocytic nevi of left upper limb, including shoulder: Secondary | ICD-10-CM | POA: Diagnosis not present

## 2022-08-06 ENCOUNTER — Other Ambulatory Visit: Payer: Self-pay | Admitting: Podiatry

## 2022-08-06 DIAGNOSIS — Z79899 Other long term (current) drug therapy: Secondary | ICD-10-CM

## 2022-08-06 MED ORDER — TERBINAFINE HCL 250 MG PO TABS: 250.0000 mg | ORAL_TABLET | Freq: Every day | ORAL | 0 refills | Status: AC

## 2022-08-19 DIAGNOSIS — R339 Retention of urine, unspecified: Secondary | ICD-10-CM | POA: Diagnosis not present

## 2022-08-19 DIAGNOSIS — N319 Neuromuscular dysfunction of bladder, unspecified: Secondary | ICD-10-CM | POA: Diagnosis not present

## 2022-08-21 ENCOUNTER — Ambulatory Visit: Payer: Medicare HMO | Admitting: Podiatry

## 2022-08-21 DIAGNOSIS — B351 Tinea unguium: Secondary | ICD-10-CM | POA: Diagnosis not present

## 2022-08-21 DIAGNOSIS — L97511 Non-pressure chronic ulcer of other part of right foot limited to breakdown of skin: Secondary | ICD-10-CM | POA: Diagnosis not present

## 2022-08-21 NOTE — Progress Notes (Signed)
Subjective: Chief Complaint  Patient presents with   Follow-up    Right foot, patient here for a follow up on a previous foot infection, patient stated foot is doing better    62 year old male presents the office for above concerns.  He states he is doing much better.  He is still on Lamisil not had any side effects.  Denies any drainage or pus or any swelling or redness increased.  He has no other concerns.  Objective: AAO x3, NAD DP/PT pulses palpable bilaterally, CRT less than 3 seconds Along the area of concern between the third and fourth toes small scabs are formed and there is no open lesions otherwise.  There is no drainage or pus.  There is no fluctuation or crepitation.  No malodor.  Nails are of the same.  No open lesions otherwise. No pain with calf compression, swelling, warmth, erythema  Assessment: Healing wounds right foot with onychomycosis  Plan: -All treatment options discussed with the patient including all alternatives, risks, complications.  -Wounds himself appear to doing much better.  There is no signs of infection.  He has been continued offloading, dressing changes. -Continue Lamisil monitoring side effects.  Will recheck CBC LFT and about a month.  Orders given. -Monitor for any clinical signs or symptoms of infection and directed to call the office immediately should any occur or go to the ER. -Patient encouraged to call the office with any questions, concerns, change in symptoms.   Trula Slade DPM

## 2022-08-28 DIAGNOSIS — G8220 Paraplegia, unspecified: Secondary | ICD-10-CM | POA: Diagnosis not present

## 2022-08-28 DIAGNOSIS — L89159 Pressure ulcer of sacral region, unspecified stage: Secondary | ICD-10-CM | POA: Diagnosis not present

## 2022-08-28 DIAGNOSIS — H7392 Unspecified disorder of tympanic membrane, left ear: Secondary | ICD-10-CM | POA: Diagnosis not present

## 2022-08-28 DIAGNOSIS — H9202 Otalgia, left ear: Secondary | ICD-10-CM | POA: Diagnosis not present

## 2022-09-02 DIAGNOSIS — H66012 Acute suppurative otitis media with spontaneous rupture of ear drum, left ear: Secondary | ICD-10-CM | POA: Diagnosis not present

## 2022-09-02 DIAGNOSIS — H903 Sensorineural hearing loss, bilateral: Secondary | ICD-10-CM | POA: Diagnosis not present

## 2022-09-11 DIAGNOSIS — Z79899 Other long term (current) drug therapy: Secondary | ICD-10-CM | POA: Diagnosis not present

## 2022-09-12 LAB — CBC WITH DIFFERENTIAL/PLATELET
Basophils Absolute: 0.1 10*3/uL (ref 0.0–0.2)
Basos: 1 %
EOS (ABSOLUTE): 0.2 10*3/uL (ref 0.0–0.4)
Eos: 2 %
Hematocrit: 48.2 % (ref 37.5–51.0)
Hemoglobin: 16 g/dL (ref 13.0–17.7)
Immature Grans (Abs): 0 10*3/uL (ref 0.0–0.1)
Immature Granulocytes: 1 %
Lymphocytes Absolute: 1.7 10*3/uL (ref 0.7–3.1)
Lymphs: 27 %
MCH: 30.1 pg (ref 26.6–33.0)
MCHC: 33.2 g/dL (ref 31.5–35.7)
MCV: 91 fL (ref 79–97)
Monocytes Absolute: 0.5 10*3/uL (ref 0.1–0.9)
Monocytes: 7 %
Neutrophils Absolute: 3.9 10*3/uL (ref 1.4–7.0)
Neutrophils: 62 %
Platelets: 323 10*3/uL (ref 150–450)
RBC: 5.31 x10E6/uL (ref 4.14–5.80)
RDW: 13.1 % (ref 11.6–15.4)
WBC: 6.4 10*3/uL (ref 3.4–10.8)

## 2022-09-12 LAB — HEPATIC FUNCTION PANEL
ALT: 29 IU/L (ref 0–44)
AST: 23 IU/L (ref 0–40)
Albumin: 4.1 g/dL (ref 3.9–4.9)
Alkaline Phosphatase: 91 IU/L (ref 44–121)
Bilirubin Total: 0.2 mg/dL (ref 0.0–1.2)
Bilirubin, Direct: <0.1 mg/dL (ref 0.00–0.40)
Total Protein: 7 g/dL (ref 6.0–8.5)

## 2022-09-24 DIAGNOSIS — H66012 Acute suppurative otitis media with spontaneous rupture of ear drum, left ear: Secondary | ICD-10-CM | POA: Diagnosis not present

## 2022-09-24 DIAGNOSIS — H903 Sensorineural hearing loss, bilateral: Secondary | ICD-10-CM | POA: Diagnosis not present

## 2022-09-28 DIAGNOSIS — L89159 Pressure ulcer of sacral region, unspecified stage: Secondary | ICD-10-CM | POA: Diagnosis not present

## 2022-09-28 DIAGNOSIS — G8220 Paraplegia, unspecified: Secondary | ICD-10-CM | POA: Diagnosis not present

## 2022-10-23 DIAGNOSIS — N3941 Urge incontinence: Secondary | ICD-10-CM | POA: Diagnosis not present

## 2022-10-23 DIAGNOSIS — N3281 Overactive bladder: Secondary | ICD-10-CM | POA: Diagnosis not present

## 2022-10-24 IMAGING — US US ABDOMEN LIMITED
1 series · 14 of 25 positions shown · non-contrast
Comparison: CT abdomen pelvis 08/09/2003

CLINICAL DATA: Upper quadrant pain.

EXAM:
ULTRASOUND ABDOMEN LIMITED RIGHT UPPER QUADRANT

[Series 1: us abdomen limited · 14 of 40 slices shown]
[im 1/40]
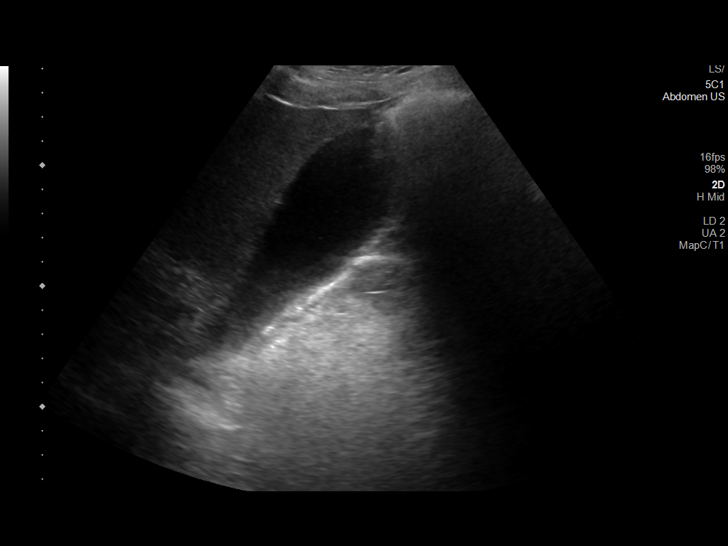
[im 4/40]
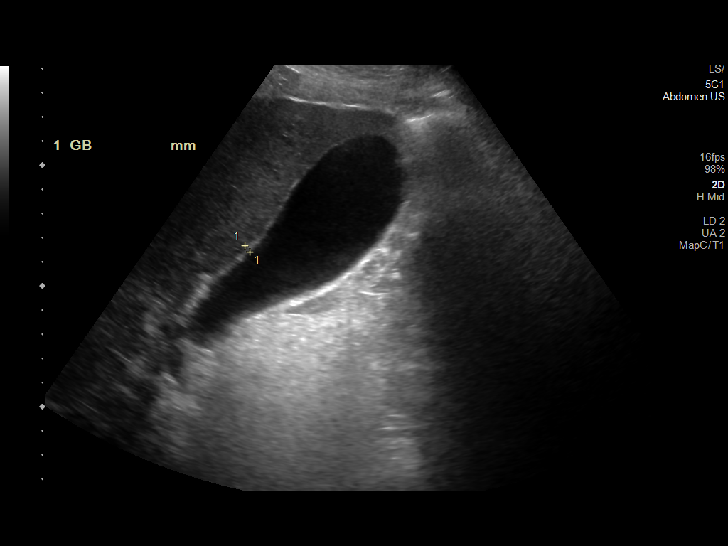
[im 7/40]
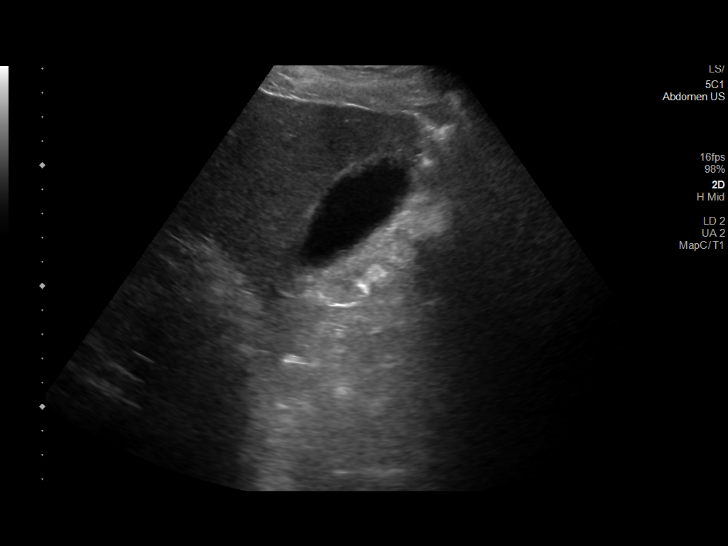
[im 10/40]
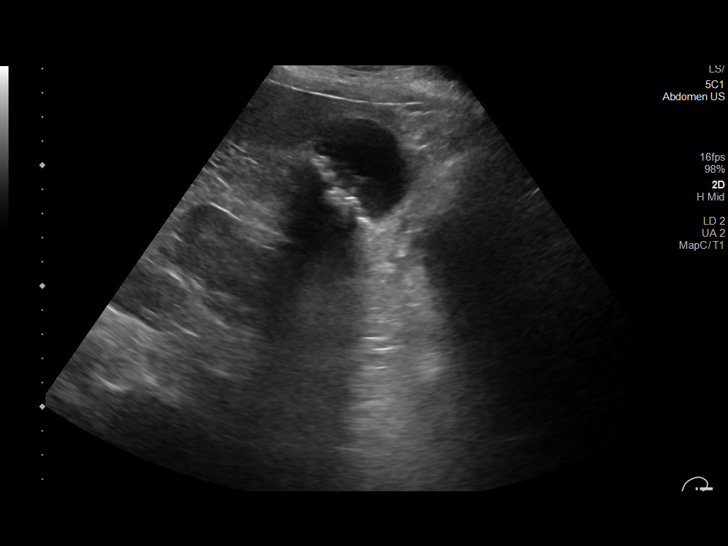
[im 14/40]
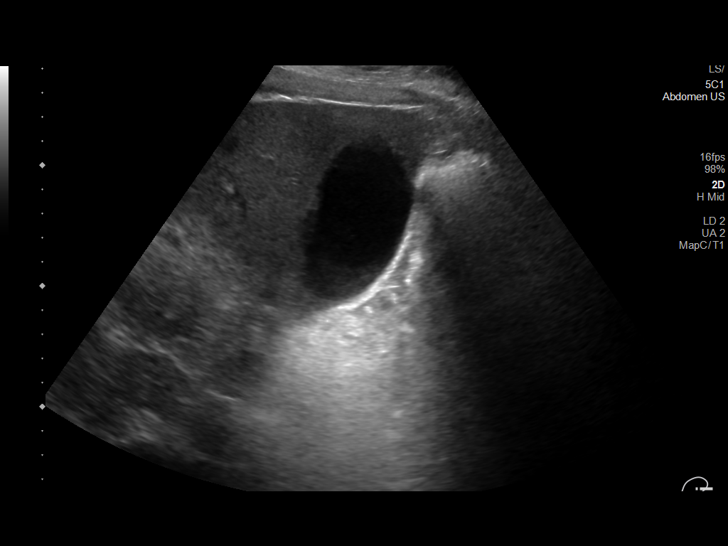
[im 15/40]
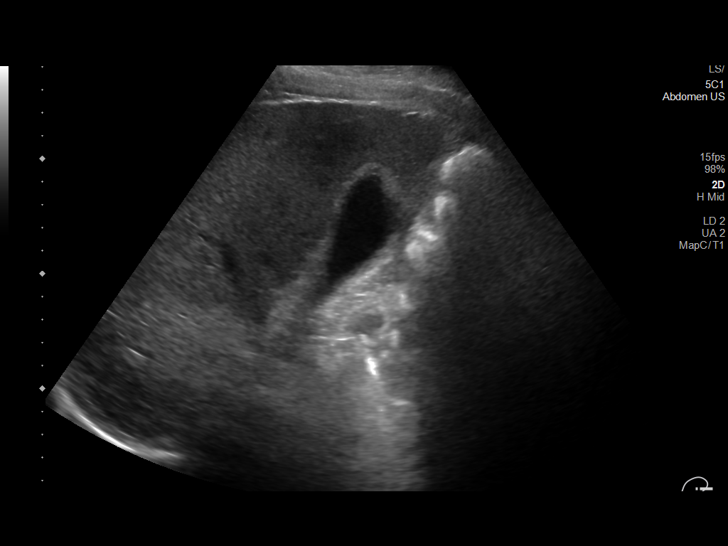
[im 18/40]
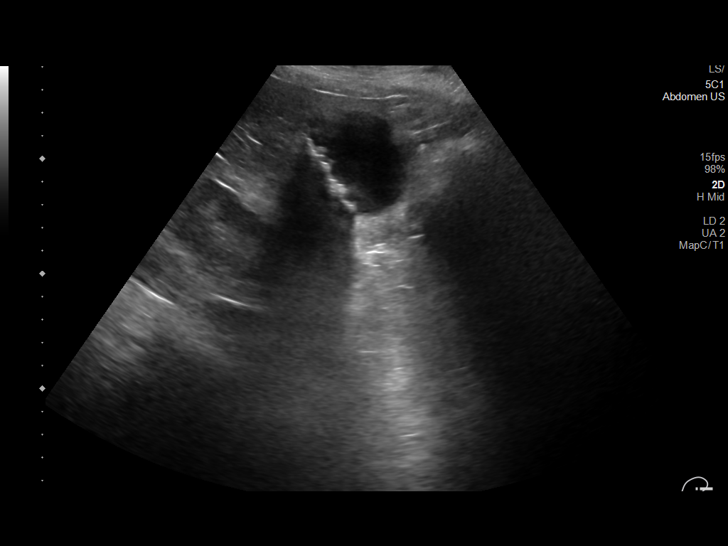
[im 22/40]
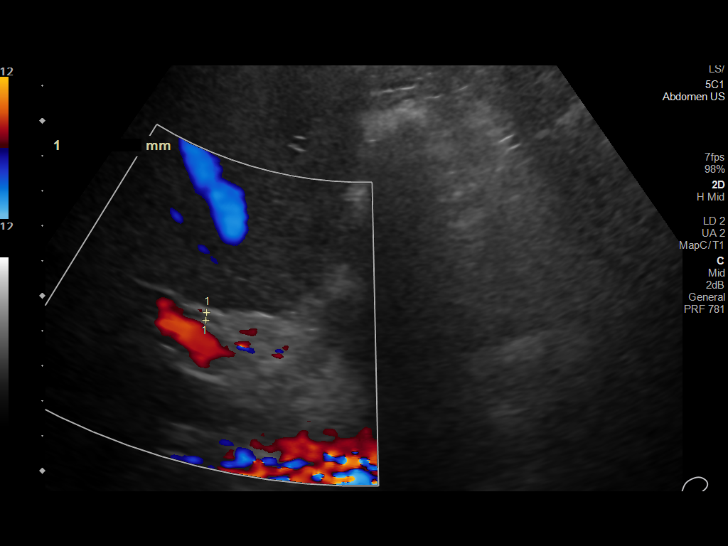
[im 25/40]
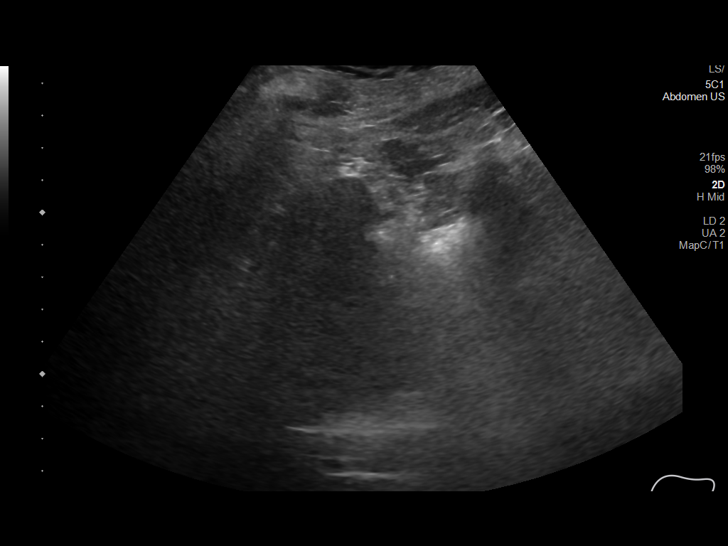
[im 27/40]
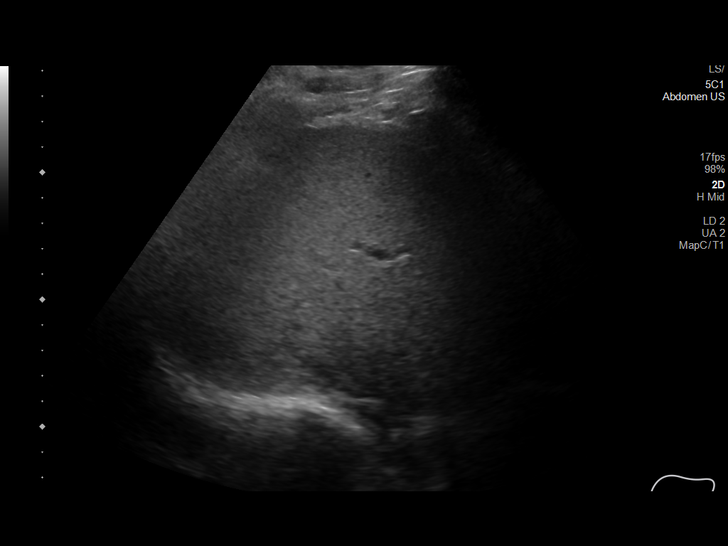
[im 30/40]
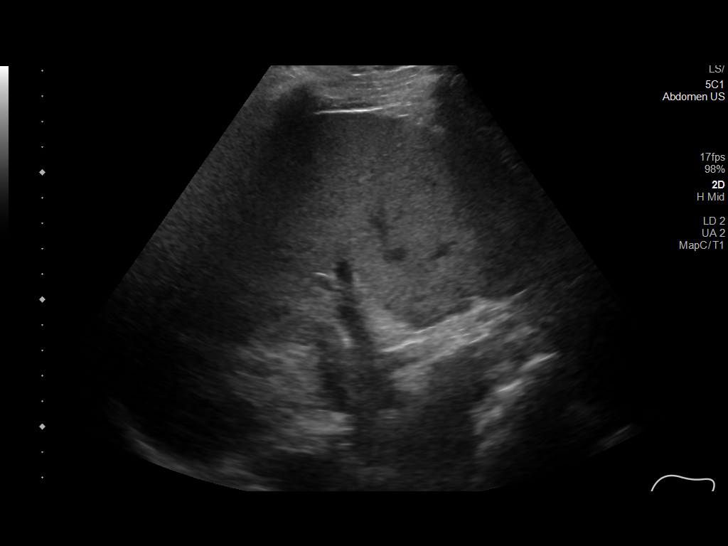
[im 33/40]
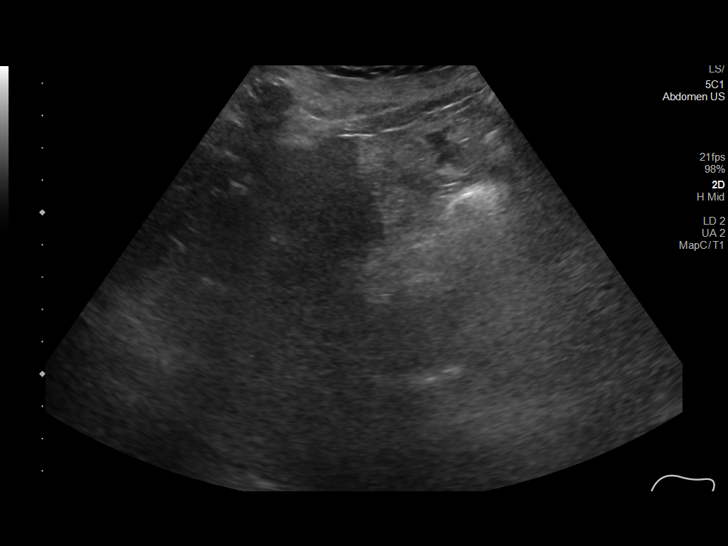
[im 36/40]
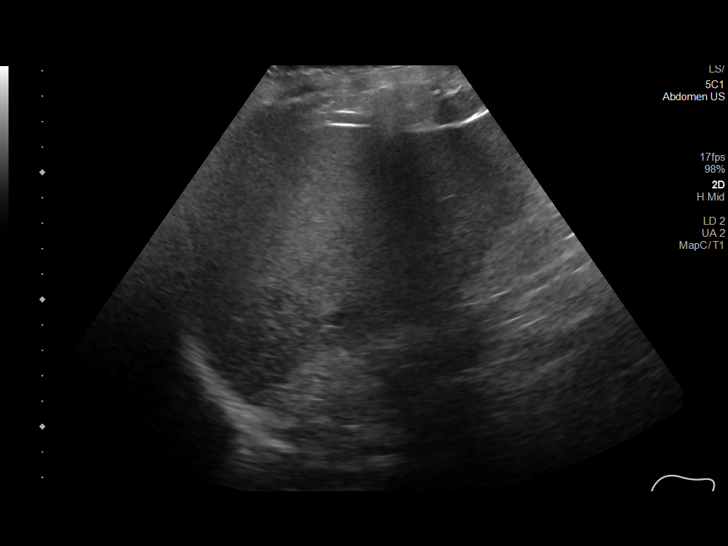
[im 40/40]
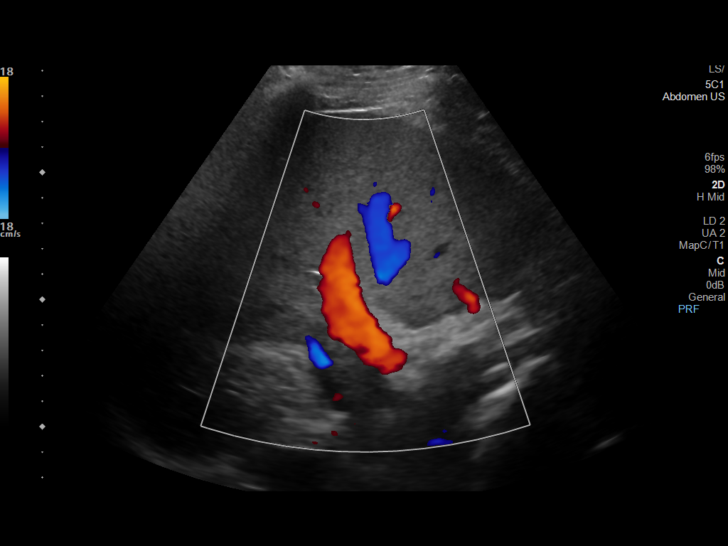

[14 of 25 positions shown; findings below may reference images not displayed]

FINDINGS: Gallbladder:

Multiple calcified gallstones within the gallbladder lumen. No
gallbladder wall thickening or pericholecystic fluid visualized. No
sonographic Murphy sign noted by sonographer.

Common bile duct:

Diameter: 2 mm

Liver:

No focal lesion identified. Increased parenchymal echogenicity.
Portal vein is patent on color Doppler imaging with normal direction
of blood flow towards the liver.

Other: None.
IMPRESSION: 1. Cholelithiasis with no findings of acute cholecystitis or
choledocholithiasis.
2. Hepatic steatosis.

## 2022-10-24 IMAGING — CR DG ABDOMEN ACUTE W/ 1V CHEST
4 series · 4 of 4 positions shown · non-contrast
Comparison: None.

CLINICAL DATA: Upper abdominal pain and nausea.

EXAM:
DG ABDOMEN ACUTE WITH 1 VIEW CHEST

[chest pa]
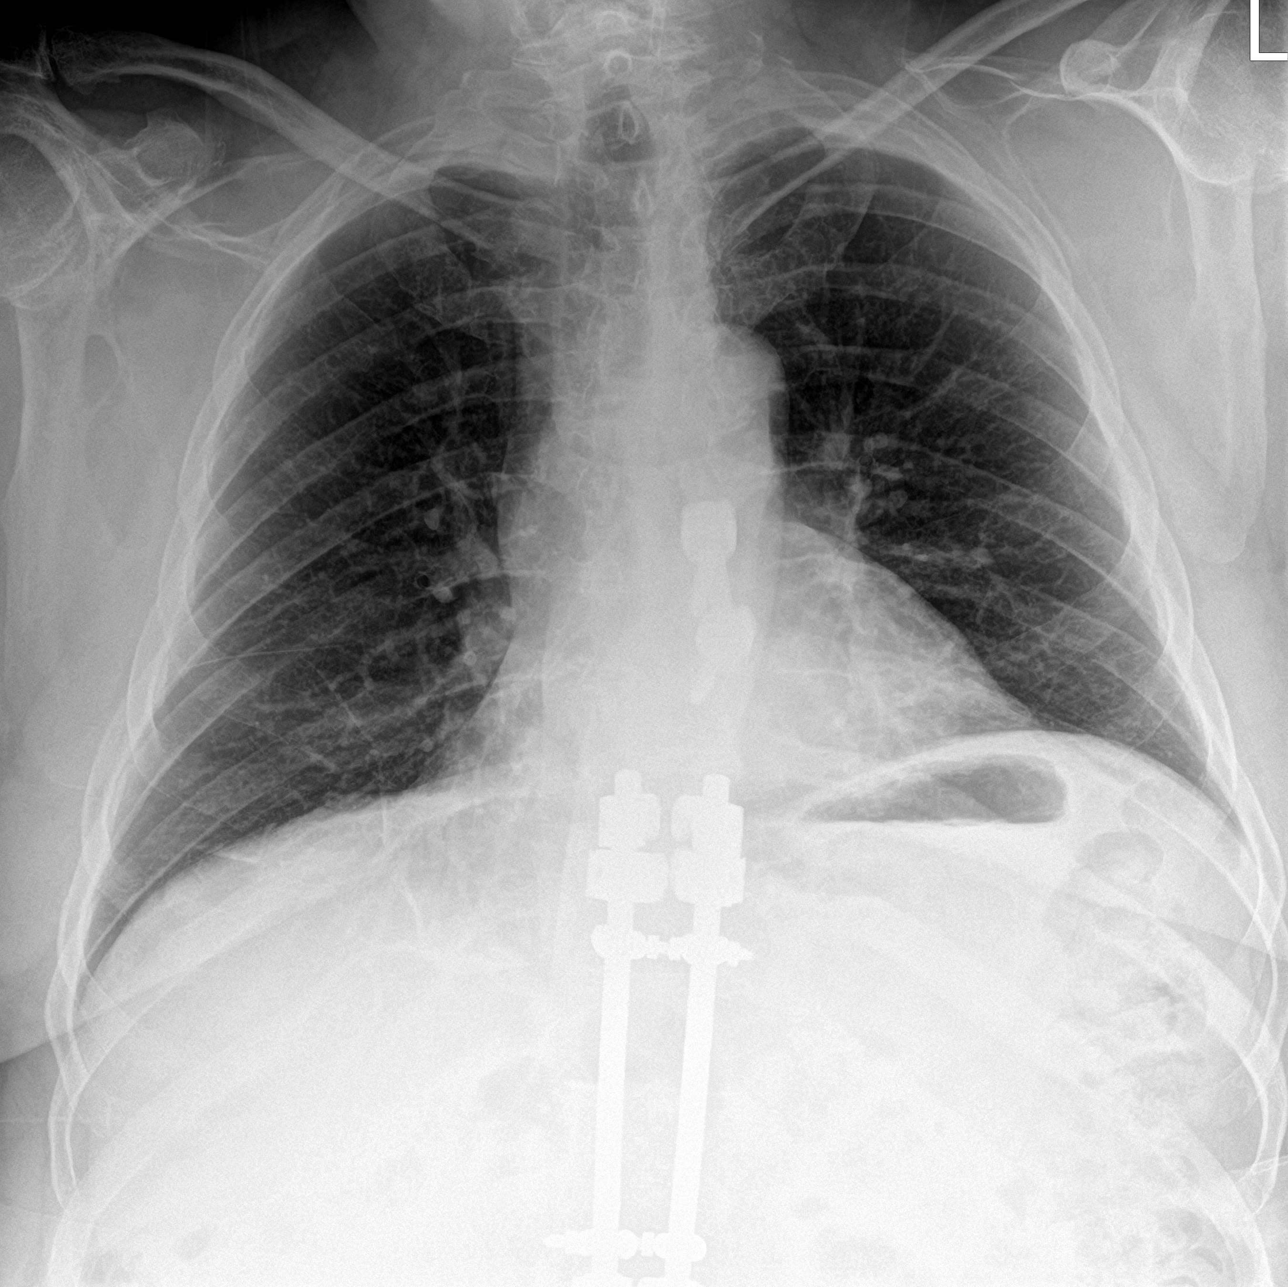

[abdomen supine (1 of 2)]
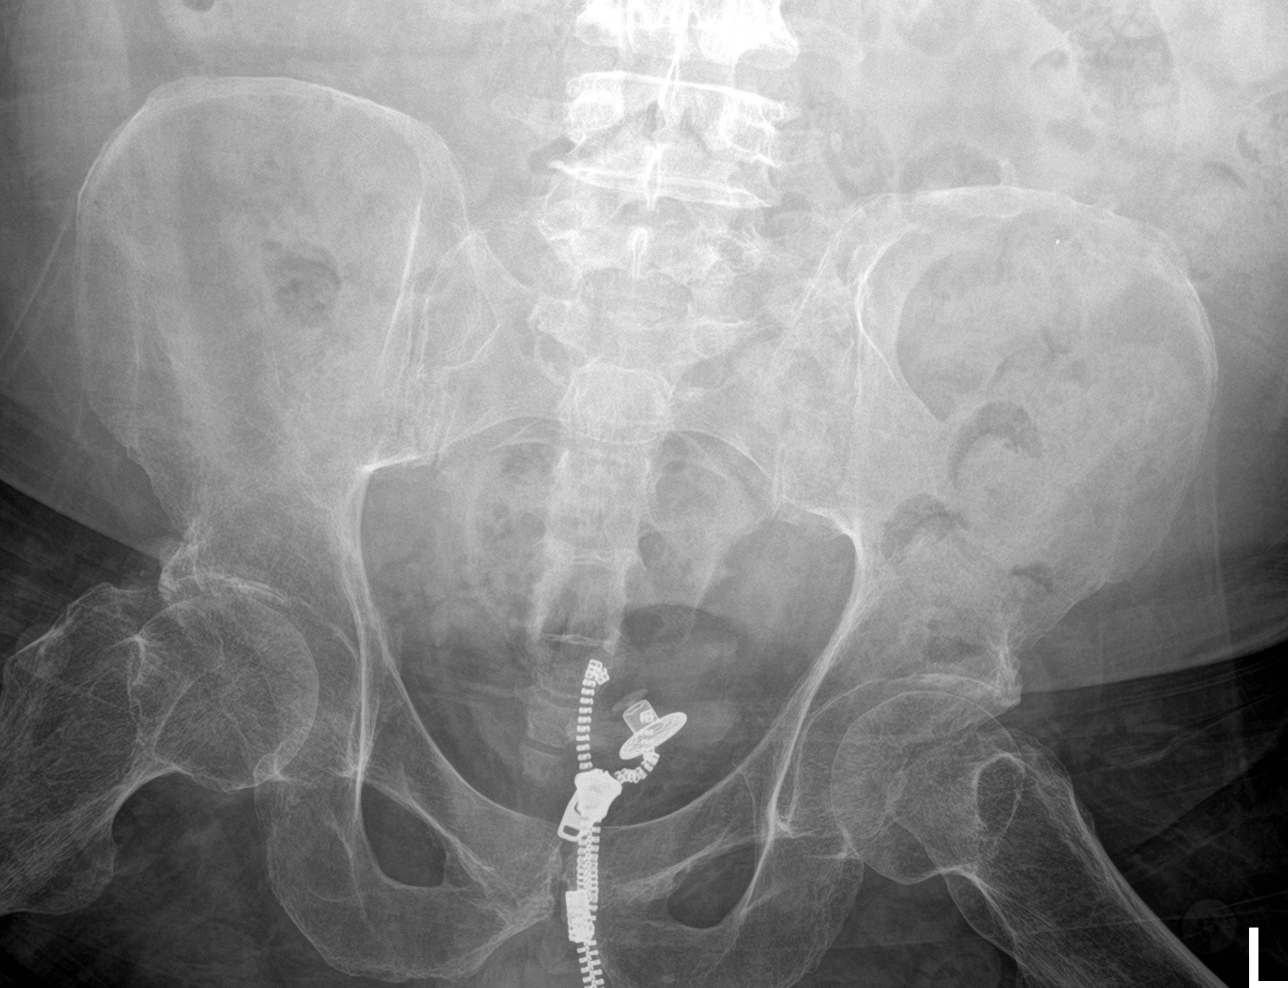

[abdomen supine (2 of 2)]
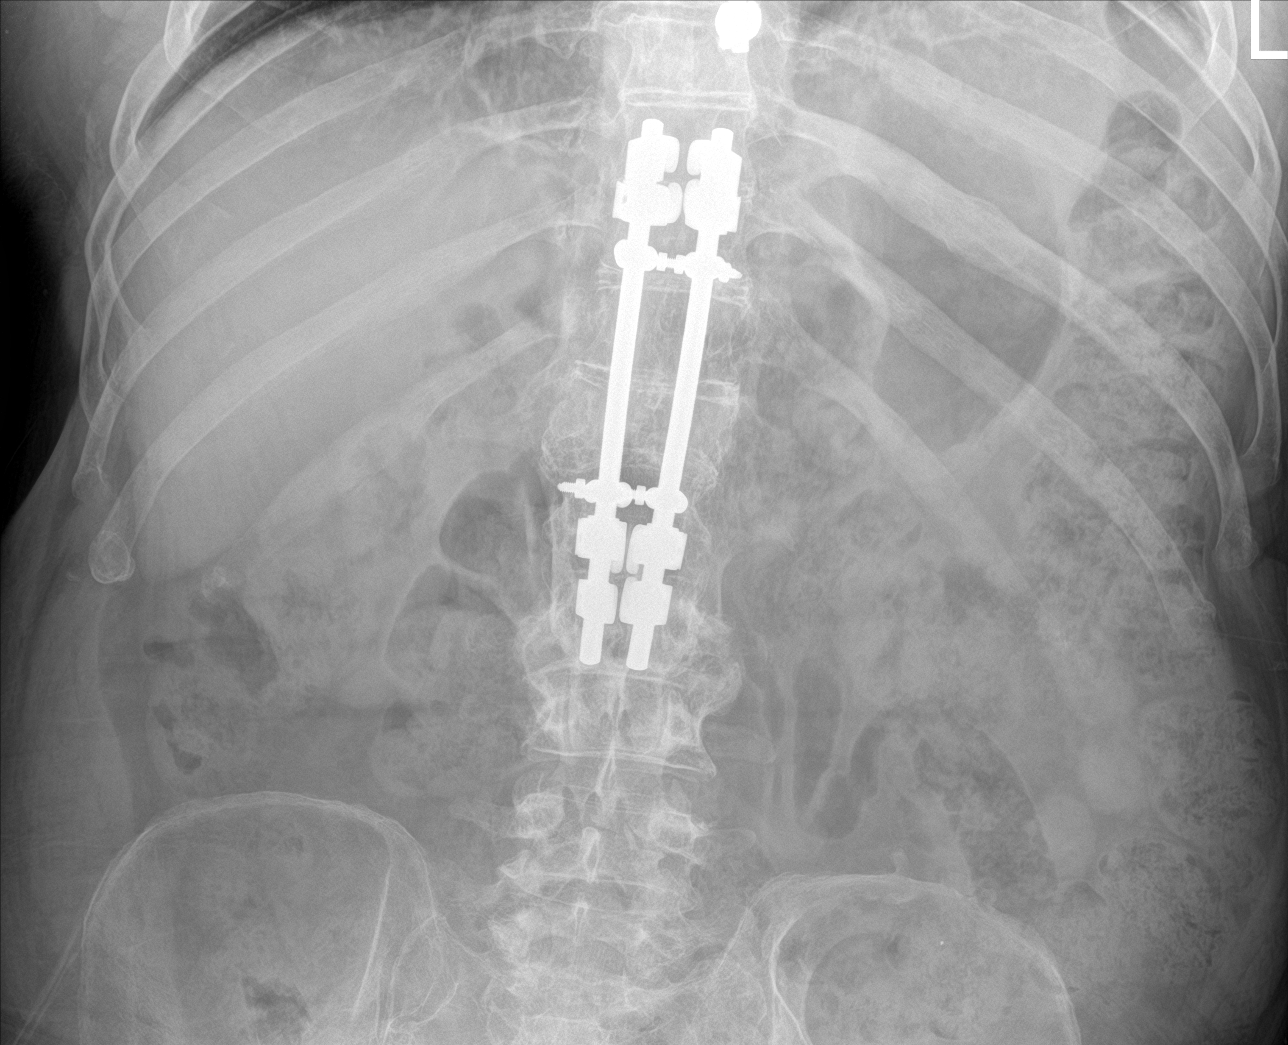

[abdomen erect]
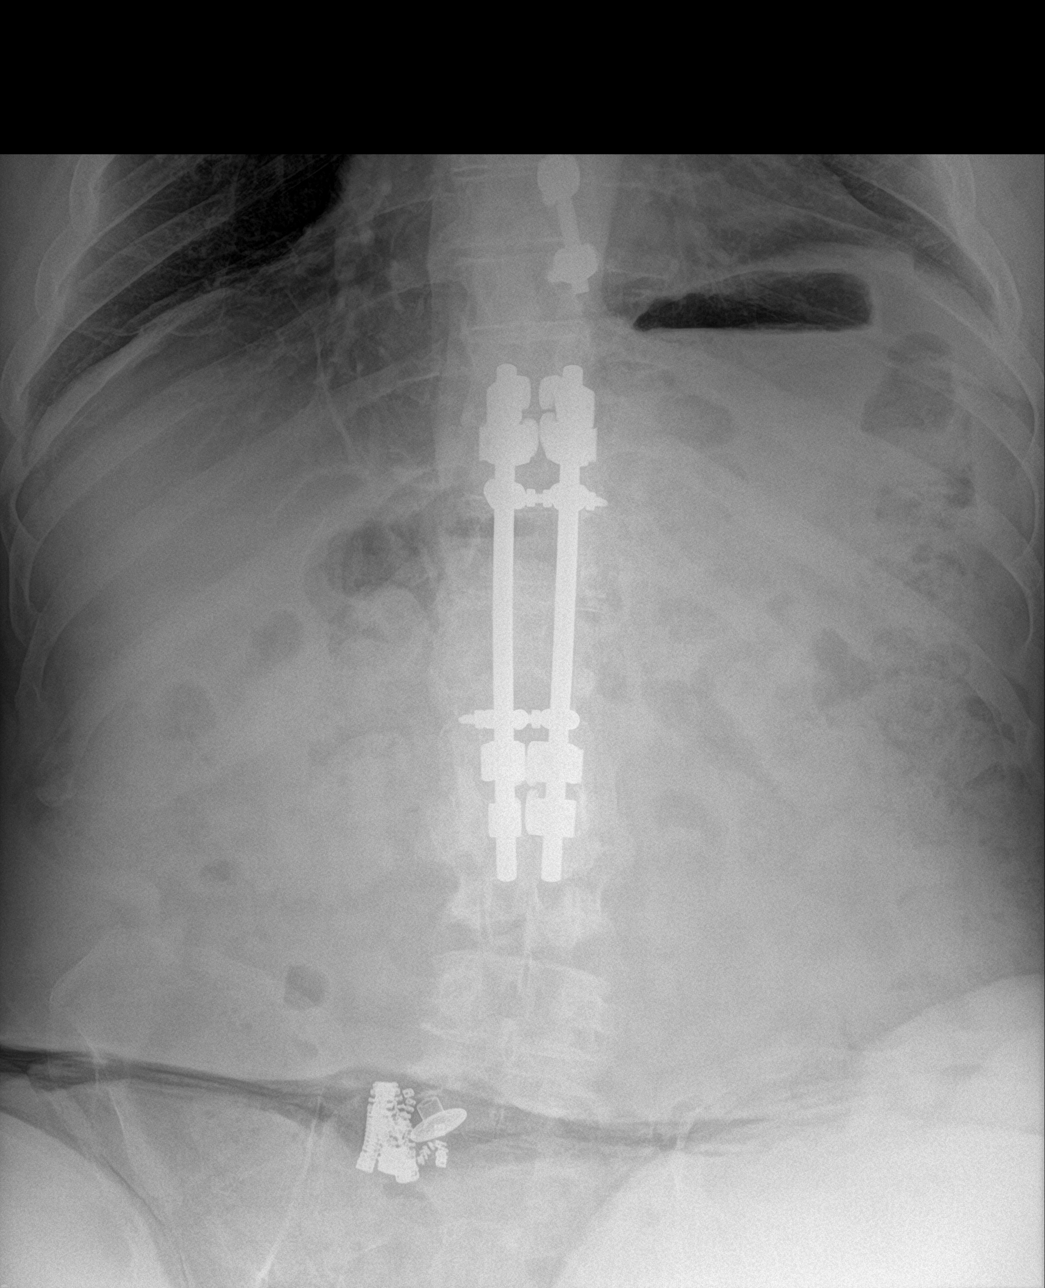

[4 of 4 positions shown; findings below may reference images not displayed]

FINDINGS: There is no evidence of dilated bowel loops or free intraperitoneal
air. A large amount of stool is seen throughout the colon. No
radiopaque calculi or other significant radiographic abnormality is
seen. Heart size and mediastinal contours are within normal limits.
Both lungs are clear. Radiopaque pedicle screws are seen within the
mid and lower thoracic spine and upper lumbar spine.
IMPRESSION: 1. Large stool burden without evidence of bowel obstruction.
2. No acute cardiopulmonary disease.
3. Postoperative changes within the thoracic and lumbar spine.

## 2022-10-28 DIAGNOSIS — G8220 Paraplegia, unspecified: Secondary | ICD-10-CM | POA: Diagnosis not present

## 2022-10-28 DIAGNOSIS — L89159 Pressure ulcer of sacral region, unspecified stage: Secondary | ICD-10-CM | POA: Diagnosis not present

## 2022-11-04 DIAGNOSIS — N3941 Urge incontinence: Secondary | ICD-10-CM | POA: Diagnosis not present

## 2022-11-11 DIAGNOSIS — R339 Retention of urine, unspecified: Secondary | ICD-10-CM | POA: Diagnosis not present

## 2022-11-11 DIAGNOSIS — N319 Neuromuscular dysfunction of bladder, unspecified: Secondary | ICD-10-CM | POA: Diagnosis not present

## 2022-11-11 DIAGNOSIS — N3946 Mixed incontinence: Secondary | ICD-10-CM | POA: Diagnosis not present

## 2022-11-13 DIAGNOSIS — E119 Type 2 diabetes mellitus without complications: Secondary | ICD-10-CM | POA: Diagnosis not present

## 2022-11-13 DIAGNOSIS — G4733 Obstructive sleep apnea (adult) (pediatric): Secondary | ICD-10-CM | POA: Diagnosis not present

## 2022-11-28 DIAGNOSIS — L89159 Pressure ulcer of sacral region, unspecified stage: Secondary | ICD-10-CM | POA: Diagnosis not present

## 2022-11-28 DIAGNOSIS — G8220 Paraplegia, unspecified: Secondary | ICD-10-CM | POA: Diagnosis not present

## 2022-12-28 DIAGNOSIS — L89159 Pressure ulcer of sacral region, unspecified stage: Secondary | ICD-10-CM | POA: Diagnosis not present

## 2022-12-28 DIAGNOSIS — G8220 Paraplegia, unspecified: Secondary | ICD-10-CM | POA: Diagnosis not present

## 2023-01-27 DIAGNOSIS — Z9989 Dependence on other enabling machines and devices: Secondary | ICD-10-CM | POA: Diagnosis not present

## 2023-01-27 DIAGNOSIS — N319 Neuromuscular dysfunction of bladder, unspecified: Secondary | ICD-10-CM | POA: Diagnosis not present

## 2023-01-27 DIAGNOSIS — Z993 Dependence on wheelchair: Secondary | ICD-10-CM | POA: Diagnosis not present

## 2023-01-27 DIAGNOSIS — E785 Hyperlipidemia, unspecified: Secondary | ICD-10-CM | POA: Diagnosis not present

## 2023-01-27 DIAGNOSIS — G8220 Paraplegia, unspecified: Secondary | ICD-10-CM | POA: Diagnosis not present

## 2023-01-27 DIAGNOSIS — E1140 Type 2 diabetes mellitus with diabetic neuropathy, unspecified: Secondary | ICD-10-CM | POA: Diagnosis not present

## 2023-01-27 DIAGNOSIS — E559 Vitamin D deficiency, unspecified: Secondary | ICD-10-CM | POA: Diagnosis not present

## 2023-01-27 DIAGNOSIS — D509 Iron deficiency anemia, unspecified: Secondary | ICD-10-CM | POA: Diagnosis not present

## 2023-01-27 DIAGNOSIS — G4733 Obstructive sleep apnea (adult) (pediatric): Secondary | ICD-10-CM | POA: Diagnosis not present

## 2023-01-28 DIAGNOSIS — L89159 Pressure ulcer of sacral region, unspecified stage: Secondary | ICD-10-CM | POA: Diagnosis not present

## 2023-01-28 DIAGNOSIS — G8220 Paraplegia, unspecified: Secondary | ICD-10-CM | POA: Diagnosis not present

## 2023-02-03 DIAGNOSIS — R339 Retention of urine, unspecified: Secondary | ICD-10-CM | POA: Diagnosis not present

## 2023-02-03 DIAGNOSIS — N319 Neuromuscular dysfunction of bladder, unspecified: Secondary | ICD-10-CM | POA: Diagnosis not present

## 2023-03-23 DIAGNOSIS — N3941 Urge incontinence: Secondary | ICD-10-CM | POA: Diagnosis not present

## 2023-03-23 DIAGNOSIS — N319 Neuromuscular dysfunction of bladder, unspecified: Secondary | ICD-10-CM | POA: Diagnosis not present

## 2023-04-01 ENCOUNTER — Emergency Department (HOSPITAL_COMMUNITY)
Admission: EM | Admit: 2023-04-01 | Discharge: 2023-04-01 | Disposition: A | Discharge status: Home / Self Care | Payer: Medicare HMO | Attending: Emergency Medicine | Admitting: Emergency Medicine

## 2023-04-01 ENCOUNTER — Emergency Department (HOSPITAL_COMMUNITY): Payer: Medicare HMO

## 2023-04-01 ENCOUNTER — Other Ambulatory Visit: Payer: Self-pay

## 2023-04-01 ENCOUNTER — Encounter (HOSPITAL_COMMUNITY): Payer: Self-pay | Admitting: Emergency Medicine

## 2023-04-01 DIAGNOSIS — E119 Type 2 diabetes mellitus without complications: Secondary | ICD-10-CM | POA: Diagnosis not present

## 2023-04-01 DIAGNOSIS — S82301A Unspecified fracture of lower end of right tibia, initial encounter for closed fracture: Secondary | ICD-10-CM | POA: Diagnosis not present

## 2023-04-01 DIAGNOSIS — M79604 Pain in right leg: Secondary | ICD-10-CM | POA: Diagnosis not present

## 2023-04-01 DIAGNOSIS — S82251A Displaced comminuted fracture of shaft of right tibia, initial encounter for closed fracture: Secondary | ICD-10-CM | POA: Diagnosis not present

## 2023-04-01 DIAGNOSIS — S82201A Unspecified fracture of shaft of right tibia, initial encounter for closed fracture: Secondary | ICD-10-CM

## 2023-04-01 DIAGNOSIS — M85871 Other specified disorders of bone density and structure, right ankle and foot: Secondary | ICD-10-CM | POA: Diagnosis not present

## 2023-04-01 DIAGNOSIS — S8991XA Unspecified injury of right lower leg, initial encounter: Secondary | ICD-10-CM | POA: Diagnosis not present

## 2023-04-01 DIAGNOSIS — S82451A Displaced comminuted fracture of shaft of right fibula, initial encounter for closed fracture: Secondary | ICD-10-CM | POA: Diagnosis not present

## 2023-04-01 DIAGNOSIS — S82831A Other fracture of upper and lower end of right fibula, initial encounter for closed fracture: Secondary | ICD-10-CM | POA: Insufficient documentation

## 2023-04-01 DIAGNOSIS — R600 Localized edema: Secondary | ICD-10-CM | POA: Diagnosis not present

## 2023-04-01 DIAGNOSIS — W1839XA Other fall on same level, initial encounter: Secondary | ICD-10-CM | POA: Diagnosis not present

## 2023-04-01 DIAGNOSIS — M85861 Other specified disorders of bone density and structure, right lower leg: Secondary | ICD-10-CM | POA: Diagnosis not present

## 2023-04-01 DIAGNOSIS — S82401A Unspecified fracture of shaft of right fibula, initial encounter for closed fracture: Secondary | ICD-10-CM | POA: Diagnosis not present
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
## 2023-04-01 NOTE — ED Provider Notes (Signed)
[place] EMERGENCY DEPARTMENT AT [facility] Provider Note   CSN: [id number] Arrival date & time: 04/01/23  1244     History  No chief complaint on file.   David Odom is a 62 y.o. male.  Patient is a 63 year old male with past medical history of L1 spinal cord injury with paraplegia and diabetes presenting to the emergency department with right leg injury.  The patient states that he injured his leg when trying to transfer from his wheelchair into the car yesterday.  He states that he was seen at his primary doctor's walk-in clinic today and had x-rays and was told that he had a fracture of his lower leg and was recommended to come to the emergency department for further evaluation.  He states he has no sensation in his leg at baseline so did not know that he had an injury.  He states that he has had prior fractures above and below the right knee that were nonoperative.  He states that he was placed in a splint at his primary doctors before being transferred here for further evaluation.  The history is provided by the patient.       Home Medications Prior to Admission medications   Medication Sig Start Date End Date Taking? Authorizing Provider  AZO-CRANBERRY PO Take 1 tablet by mouth in the morning and at bedtime.    [provider]  D-MANNOSE PO Take 1 capsule by mouth daily.    [provider]  ergocalciferol (VITAMIN D2) 50000 UNITS capsule Take 50,000 Units by mouth every Thursday.     [provider]  ketoconazole (NIZORAL) 2 % cream Apply 1 Application topically daily. 08/01/22   Vivi Barrack, DPM  methenamine (HIPREX) 1 g tablet Take 1 g by mouth 2 (two) times daily. 03/21/21   [provider]  naproxen sodium (ALEVE) 220 MG tablet Take 220 mg by mouth daily as needed (pain).    [provider]  nortriptyline (PAMELOR) 75 MG capsule Take 75 mg by mouth at bedtime.     [provider]  Probiotic Product  (PROBIOTIC PO) Take 1 tablet by mouth daily.    [provider]  terbinafine (LAMISIL) 250 MG tablet Take 1 tablet (250 mg total) by mouth daily. 08/06/22   Vivi Barrack, DPM  vitamin C (ASCORBIC ACID) 500 MG tablet Take 500 mg by mouth 2 (two) times daily.    [provider]      Allergies    Bee venom    Review of Systems   Review of Systems  Physical Exam Updated Vital Signs BP (!) 141/81   Pulse 95   Temp 98 F (36.7 C)   Resp 16   Ht 5\' 11"  (1.803 m)   Wt 93.4 kg   SpO2 100%   BMI 28.73 kg/m  Physical Exam Vitals and nursing note reviewed.  Constitutional:      General: He is not in acute distress.    Appearance: Normal appearance.  HENT:     Head: Normocephalic and atraumatic.     Nose: Nose normal.     Mouth/Throat:     Mouth: Mucous membranes are moist.  Eyes:     Extraocular Movements: Extraocular movements intact.     Conjunctiva/sclera: Conjunctivae normal.  Cardiovascular:     Rate and Rhythm: Normal rate and regular rhythm.     Heart sounds: Normal heart sounds.  Pulmonary:     Effort: Pulmonary effort is normal.  Abdominal:     General: Abdomen is flat.  Musculoskeletal:     Cervical back: Normal range of motion.     Comments: Paraplegic in bilateral LE No obvious deformity in LLE RLE in posterior leg splint, edema to R foot, R toes purple (baseline per patient), brisk capillary refill, no obvious swelling or deformity to R femur  Skin:    General: Skin is warm and dry.     Capillary Refill: Capillary refill takes less than 2 seconds.  Neurological:     Mental Status: He is alert and oriented to person, place, and time. Mental status is at baseline.  Psychiatric:        Mood and Affect: Mood normal.        Behavior: Behavior normal.     ED Results / Procedures / Treatments   Labs (all labs ordered are listed, but only abnormal results are displayed) Labs Reviewed - No data to display  EKG None  Radiology DG Ankle  Complete Right  Result Date: 04/01/2023 CLINICAL DATA:  Post fall, now with right lower leg pain. History of paralysis. EXAM: RIGHT ANKLE - COMPLETE 3+ VIEW COMPARISON:  Right tibia and knee radiographs-earlier same day FINDINGS: Osteopenia. There is a comminuted fracture involving the distal tibia with potential extension to involve the ankle mortise. Additionally, there is a comminuted fracture involving the proximal fibula without definitive intra-articular extension. Expected adjacent soft tissue swelling.  No radiopaque foreign body. Old fractures and residual deformity involving the tibial plateau and distal femur. No definite knee joint effusion. 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 No radiopaque foreign body. IMPRESSION: 1. Comminuted fracture of the distal tibia with potential extension to involve the ankle mortise. 2. Comminuted fracture involving the proximal fibula. 3. Old fractures and residual deformity involving the tibial plateau and distal femur. Electronically Signed   By: Simonne Come M.D.   On: 04/01/2023 14:49   DG Knee 1-2 Views Right  Result Date: 04/01/2023 CLINICAL DATA:  Post fall now with right lower leg pain. History of paralysis. EXAM: RIGHT KNEE - 1-2 VIEW COMPARISON:  Right tibia and fibula and ankle radiographs-earlier same day FINDINGS: Osteopenia. There is a comminuted fracture involving the distal tibia with potential extension to involve the ankle mortise. Additionally, there is a comminuted fracture involving the proximal fibula without definitive intra-articular extension. Expected adjacent soft tissue swelling.  No radiopaque foreign body. Old fractures and residual deformity involving the tibial plateau and distal femur. No definite knee joint effusion. 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 No radiopaque foreign body. IMPRESSION: 1. Comminuted fracture of the distal tibia with potential extension to involve  the ankle mortise. 2. Comminuted fracture involving the proximal fibula. 3. Old fractures and residual deformity involving the tibial plateau and distal femur. Electronically Signed   By: Simonne Come M.D.   On: 04/01/2023 14:48   DG Tibia/Fibula Right  Result Date: 04/01/2023 CLINICAL DATA:  Evaluate tib-fib fracture.  History of paralysis. EXAM: RIGHT TIBIA AND FIBULA - 2 VIEW COMPARISON:  Right ankle and knee radiographs-earlier same day FINDINGS: Osteopenia. There is a comminuted fracture involving the distal tibia with potential extension to involve the ankle mortise. Additionally, there is a comminuted fracture involving the proximal fibula without definitive intra-articular extension. Expected adjacent soft tissue swelling.  No radiopaque foreign body. Old fractures and residual deformity involving the tibial plateau and distal femur. No definite knee joint effusion. 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 No radiopaque  foreign body. IMPRESSION: 1. Comminuted fracture of the distal tibia with potential extension to involve the ankle mortise. 2. Comminuted fracture involving the proximal fibula. 3. Old fractures and residual deformity involving the tibial plateau and distal femur. Electronically Signed   By: Simonne Come M.D.   On: 04/01/2023 14:47    Procedures Procedures    Medications Ordered in ED Medications - No data to display  ED Course/ Medical Decision Making/ A&P Clinical Course as of 04/01/23 1525  Wed Apr 01, 2023  1506 I spoke with Aundria Rud from orthopedics who recommended stirrups and shortleg with lots of padding. Strict elevation when able. Recommend CT of ankle after the new splint. Can follow up in the office after the CT.  [VK]  1521 Patient's splint taken down by myself. 2+ edema to the knee but compartments are soft. Patient signed out to Dr. Adela Lank pending splint placement and CT with plan for discharge with outpatient follow up. [VK]     Clinical Course User Index [VK] Rexford Maus, DO                                 Medical Decision Making This patient presents to the ED with chief complaint(s) of RLE injury with pertinent past medical history of paraplegia from L1 spinal cord injury, DM which further complicates the presenting complaint. The complaint involves an extensive differential diagnosis and also carries with it a high risk of complications and morbidity.    The differential diagnosis includes patient is known fracture in right lower extremity, no other traumatic injuries seen, appears well-perfused  Additional history obtained: Additional history obtained from family Records reviewed Care Everywhere/External Records  ED Course and Reassessment: On patient's arrival he is hemodynamically stable in no acute distress.  They are unable to access the records from his primary doctor's office so he had repeat x-rays performed in triage.  He does have right comminuted tib-fib fractures.  Orthopedics will be consulted.  Independent labs interpretation:  N/A  Independent visualization of imaging: - I independently visualized the following imaging with scope of interpretation limited to determining acute life threatening conditions related to emergency care: R knee/tib/fib/ankle XR, which revealed comminuted tib/fib fracture  Consultation: - Consulted or discussed management/test interpretation w/ external professional: orthopedics    Amount and/or Complexity of Data Reviewed Radiology: ordered.          Final Clinical Impression(s) / ED Diagnoses Final diagnoses:  Closed fracture of right tibia and fibula, initial encounter    Rx / DC Orders ED Discharge Orders     None         Rexford Maus, DO 04/01/23 1526

## 2023-04-01 NOTE — Progress Notes (Signed)
Orthopedic Tech Progress Note Patient Details:  David Odom [birth year] [id number]  Ortho Devices Type of Ortho Device: Short leg splint, Stirrup splint Ortho Device/Splint Location: RLE Ortho Device/Splint Interventions: Application   Post Interventions Patient Tolerated: Well  Genelle Bal Kora Groom 04/01/2023, 3:46 PM

## 2023-04-01 NOTE — Discharge Instructions (Addendum)
Were seen in the emergency department for your right leg injury.  You did break both of your shin bones.  We placed you into a splint and you should keep this on at all times until you follow-up with orthopedics.  You should try to elevate your leg to help with the swelling and you can ice it.  You should follow-up with the orthopedic doctor in the next few days to have your leg rechecked and for further management.  You should return to the emergency department if your toes turn black or blue or if you have any other new or concerning symptoms.

## 2023-04-15 DIAGNOSIS — S82201A Unspecified fracture of shaft of right tibia, initial encounter for closed fracture: Secondary | ICD-10-CM | POA: Diagnosis not present

## 2023-04-28 DIAGNOSIS — H52223 Regular astigmatism, bilateral: Secondary | ICD-10-CM | POA: Diagnosis not present

## 2023-04-28 DIAGNOSIS — H2513 Age-related nuclear cataract, bilateral: Secondary | ICD-10-CM | POA: Diagnosis not present

## 2023-04-28 DIAGNOSIS — H5201 Hypermetropia, right eye: Secondary | ICD-10-CM | POA: Diagnosis not present

## 2023-04-28 DIAGNOSIS — H25013 Cortical age-related cataract, bilateral: Secondary | ICD-10-CM | POA: Diagnosis not present

## 2023-04-28 DIAGNOSIS — Z0100 Encounter for examination of eyes and vision without abnormal findings: Secondary | ICD-10-CM | POA: Diagnosis not present

## 2023-04-28 DIAGNOSIS — H524 Presbyopia: Secondary | ICD-10-CM | POA: Diagnosis not present

## 2023-04-28 DIAGNOSIS — E119 Type 2 diabetes mellitus without complications: Secondary | ICD-10-CM | POA: Diagnosis not present

## 2023-04-28 DIAGNOSIS — H5212 Myopia, left eye: Secondary | ICD-10-CM | POA: Diagnosis not present

## 2023-04-29 DIAGNOSIS — S82201D Unspecified fracture of shaft of right tibia, subsequent encounter for closed fracture with routine healing: Secondary | ICD-10-CM | POA: Diagnosis not present

## 2023-04-30 DIAGNOSIS — N390 Urinary tract infection, site not specified: Secondary | ICD-10-CM | POA: Diagnosis not present

## 2023-04-30 DIAGNOSIS — R509 Fever, unspecified: Secondary | ICD-10-CM | POA: Diagnosis not present

## 2023-05-04 DIAGNOSIS — N3941 Urge incontinence: Secondary | ICD-10-CM | POA: Diagnosis not present

## 2023-05-11 DIAGNOSIS — R339 Retention of urine, unspecified: Secondary | ICD-10-CM | POA: Diagnosis not present

## 2023-05-11 DIAGNOSIS — N319 Neuromuscular dysfunction of bladder, unspecified: Secondary | ICD-10-CM | POA: Diagnosis not present

## 2023-05-18 DIAGNOSIS — N3941 Urge incontinence: Secondary | ICD-10-CM | POA: Diagnosis not present

## 2023-05-18 DIAGNOSIS — N319 Neuromuscular dysfunction of bladder, unspecified: Secondary | ICD-10-CM | POA: Diagnosis not present

## 2023-05-18 DIAGNOSIS — N390 Urinary tract infection, site not specified: Secondary | ICD-10-CM | POA: Diagnosis not present

## 2023-05-18 DIAGNOSIS — N3281 Overactive bladder: Secondary | ICD-10-CM | POA: Diagnosis not present

## 2023-05-20 DIAGNOSIS — S82201S Unspecified fracture of shaft of right tibia, sequela: Secondary | ICD-10-CM | POA: Diagnosis not present

## 2023-05-20 DIAGNOSIS — M79604 Pain in right leg: Secondary | ICD-10-CM | POA: Diagnosis not present

## 2023-06-15 DIAGNOSIS — M84361A Stress fracture, right tibia, initial encounter for fracture: Secondary | ICD-10-CM | POA: Diagnosis not present

## 2023-06-23 DIAGNOSIS — S82201D Unspecified fracture of shaft of right tibia, subsequent encounter for closed fracture with routine healing: Secondary | ICD-10-CM | POA: Diagnosis not present

## 2023-07-31 DIAGNOSIS — N319 Neuromuscular dysfunction of bladder, unspecified: Secondary | ICD-10-CM | POA: Diagnosis not present

## 2023-07-31 DIAGNOSIS — D500 Iron deficiency anemia secondary to blood loss (chronic): Secondary | ICD-10-CM | POA: Diagnosis not present

## 2023-07-31 DIAGNOSIS — Z1331 Encounter for screening for depression: Secondary | ICD-10-CM | POA: Diagnosis not present

## 2023-07-31 DIAGNOSIS — Z23 Encounter for immunization: Secondary | ICD-10-CM | POA: Diagnosis not present

## 2023-07-31 DIAGNOSIS — Z0000 Encounter for general adult medical examination without abnormal findings: Secondary | ICD-10-CM | POA: Diagnosis not present

## 2023-07-31 DIAGNOSIS — G4733 Obstructive sleep apnea (adult) (pediatric): Secondary | ICD-10-CM | POA: Diagnosis not present

## 2023-07-31 DIAGNOSIS — E1140 Type 2 diabetes mellitus with diabetic neuropathy, unspecified: Secondary | ICD-10-CM | POA: Diagnosis not present

## 2023-07-31 DIAGNOSIS — R972 Elevated prostate specific antigen [PSA]: Secondary | ICD-10-CM | POA: Diagnosis not present

## 2023-07-31 DIAGNOSIS — E559 Vitamin D deficiency, unspecified: Secondary | ICD-10-CM | POA: Diagnosis not present

## 2023-07-31 DIAGNOSIS — G8220 Paraplegia, unspecified: Secondary | ICD-10-CM | POA: Diagnosis not present

## 2023-08-03 DIAGNOSIS — N319 Neuromuscular dysfunction of bladder, unspecified: Secondary | ICD-10-CM | POA: Diagnosis not present

## 2023-08-03 DIAGNOSIS — R339 Retention of urine, unspecified: Secondary | ICD-10-CM | POA: Diagnosis not present

## 2023-10-26 DIAGNOSIS — N319 Neuromuscular dysfunction of bladder, unspecified: Secondary | ICD-10-CM | POA: Diagnosis not present

## 2023-10-26 DIAGNOSIS — N3281 Overactive bladder: Secondary | ICD-10-CM | POA: Diagnosis not present

## 2023-10-26 DIAGNOSIS — R339 Retention of urine, unspecified: Secondary | ICD-10-CM | POA: Diagnosis not present

## 2023-11-14 DIAGNOSIS — E785 Hyperlipidemia, unspecified: Secondary | ICD-10-CM | POA: Diagnosis not present

## 2023-11-14 DIAGNOSIS — E1140 Type 2 diabetes mellitus with diabetic neuropathy, unspecified: Secondary | ICD-10-CM | POA: Diagnosis not present

## 2023-11-14 DIAGNOSIS — M810 Age-related osteoporosis without current pathological fracture: Secondary | ICD-10-CM | POA: Diagnosis not present

## 2023-11-16 DIAGNOSIS — E1140 Type 2 diabetes mellitus with diabetic neuropathy, unspecified: Secondary | ICD-10-CM | POA: Diagnosis not present

## 2023-11-19 DIAGNOSIS — G4733 Obstructive sleep apnea (adult) (pediatric): Secondary | ICD-10-CM | POA: Diagnosis not present

## 2023-11-19 DIAGNOSIS — H6980 Other specified disorders of Eustachian tube, unspecified ear: Secondary | ICD-10-CM | POA: Diagnosis not present

## 2023-11-26 DIAGNOSIS — N3281 Overactive bladder: Secondary | ICD-10-CM | POA: Diagnosis not present

## 2023-11-26 DIAGNOSIS — N3941 Urge incontinence: Secondary | ICD-10-CM | POA: Diagnosis not present

## 2023-12-14 DIAGNOSIS — E785 Hyperlipidemia, unspecified: Secondary | ICD-10-CM | POA: Diagnosis not present

## 2023-12-14 DIAGNOSIS — E1140 Type 2 diabetes mellitus with diabetic neuropathy, unspecified: Secondary | ICD-10-CM | POA: Diagnosis not present

## 2023-12-14 DIAGNOSIS — M810 Age-related osteoporosis without current pathological fracture: Secondary | ICD-10-CM | POA: Diagnosis not present

## 2024-01-14 DIAGNOSIS — E1140 Type 2 diabetes mellitus with diabetic neuropathy, unspecified: Secondary | ICD-10-CM | POA: Diagnosis not present

## 2024-01-14 DIAGNOSIS — M810 Age-related osteoporosis without current pathological fracture: Secondary | ICD-10-CM | POA: Diagnosis not present

## 2024-01-14 DIAGNOSIS — E785 Hyperlipidemia, unspecified: Secondary | ICD-10-CM | POA: Diagnosis not present

## 2024-01-18 DIAGNOSIS — N319 Neuromuscular dysfunction of bladder, unspecified: Secondary | ICD-10-CM | POA: Diagnosis not present

## 2024-01-18 DIAGNOSIS — R339 Retention of urine, unspecified: Secondary | ICD-10-CM | POA: Diagnosis not present

## 2024-01-19 DIAGNOSIS — H2513 Age-related nuclear cataract, bilateral: Secondary | ICD-10-CM | POA: Diagnosis not present

## 2024-01-19 DIAGNOSIS — H2512 Age-related nuclear cataract, left eye: Secondary | ICD-10-CM | POA: Diagnosis not present

## 2024-01-19 DIAGNOSIS — E119 Type 2 diabetes mellitus without complications: Secondary | ICD-10-CM | POA: Diagnosis not present

## 2024-01-19 DIAGNOSIS — H18413 Arcus senilis, bilateral: Secondary | ICD-10-CM | POA: Diagnosis not present

## 2024-01-19 DIAGNOSIS — H25043 Posterior subcapsular polar age-related cataract, bilateral: Secondary | ICD-10-CM | POA: Diagnosis not present

## 2024-01-19 DIAGNOSIS — H25013 Cortical age-related cataract, bilateral: Secondary | ICD-10-CM | POA: Diagnosis not present

## 2024-01-25 DIAGNOSIS — N319 Neuromuscular dysfunction of bladder, unspecified: Secondary | ICD-10-CM | POA: Diagnosis not present

## 2024-01-25 DIAGNOSIS — E1140 Type 2 diabetes mellitus with diabetic neuropathy, unspecified: Secondary | ICD-10-CM | POA: Diagnosis not present

## 2024-01-25 DIAGNOSIS — E785 Hyperlipidemia, unspecified: Secondary | ICD-10-CM | POA: Diagnosis not present

## 2024-01-25 DIAGNOSIS — G4733 Obstructive sleep apnea (adult) (pediatric): Secondary | ICD-10-CM | POA: Diagnosis not present

## 2024-01-25 DIAGNOSIS — R972 Elevated prostate specific antigen [PSA]: Secondary | ICD-10-CM | POA: Diagnosis not present

## 2024-01-25 DIAGNOSIS — G8220 Paraplegia, unspecified: Secondary | ICD-10-CM | POA: Diagnosis not present

## 2024-02-13 DIAGNOSIS — E1140 Type 2 diabetes mellitus with diabetic neuropathy, unspecified: Secondary | ICD-10-CM | POA: Diagnosis not present

## 2024-02-14 DIAGNOSIS — M810 Age-related osteoporosis without current pathological fracture: Secondary | ICD-10-CM | POA: Diagnosis not present

## 2024-02-14 DIAGNOSIS — E785 Hyperlipidemia, unspecified: Secondary | ICD-10-CM | POA: Diagnosis not present

## 2024-02-14 DIAGNOSIS — E1140 Type 2 diabetes mellitus with diabetic neuropathy, unspecified: Secondary | ICD-10-CM | POA: Diagnosis not present

## 2024-03-14 DIAGNOSIS — E1140 Type 2 diabetes mellitus with diabetic neuropathy, unspecified: Secondary | ICD-10-CM | POA: Diagnosis not present

## 2024-03-15 DIAGNOSIS — E1140 Type 2 diabetes mellitus with diabetic neuropathy, unspecified: Secondary | ICD-10-CM | POA: Diagnosis not present

## 2024-03-15 DIAGNOSIS — M810 Age-related osteoporosis without current pathological fracture: Secondary | ICD-10-CM | POA: Diagnosis not present

## 2024-03-15 DIAGNOSIS — E785 Hyperlipidemia, unspecified: Secondary | ICD-10-CM | POA: Diagnosis not present

## 2024-03-15 DIAGNOSIS — G4733 Obstructive sleep apnea (adult) (pediatric): Secondary | ICD-10-CM | POA: Diagnosis not present

## 2024-03-15 DIAGNOSIS — H9192 Unspecified hearing loss, left ear: Secondary | ICD-10-CM | POA: Diagnosis not present

## 2024-03-15 DIAGNOSIS — H269 Unspecified cataract: Secondary | ICD-10-CM | POA: Diagnosis not present

## 2024-03-15 DIAGNOSIS — N319 Neuromuscular dysfunction of bladder, unspecified: Secondary | ICD-10-CM | POA: Diagnosis not present

## 2024-03-15 DIAGNOSIS — G629 Polyneuropathy, unspecified: Secondary | ICD-10-CM | POA: Diagnosis not present

## 2024-03-15 DIAGNOSIS — K760 Fatty (change of) liver, not elsewhere classified: Secondary | ICD-10-CM | POA: Diagnosis not present

## 2024-03-15 DIAGNOSIS — Z91030 Bee allergy status: Secondary | ICD-10-CM | POA: Diagnosis not present

## 2024-03-15 DIAGNOSIS — Z9989 Dependence on other enabling machines and devices: Secondary | ICD-10-CM | POA: Diagnosis not present

## 2024-03-15 DIAGNOSIS — M1990 Unspecified osteoarthritis, unspecified site: Secondary | ICD-10-CM | POA: Diagnosis not present

## 2024-03-15 DIAGNOSIS — Z86718 Personal history of other venous thrombosis and embolism: Secondary | ICD-10-CM | POA: Diagnosis not present

## 2024-03-15 DIAGNOSIS — Z87440 Personal history of urinary (tract) infections: Secondary | ICD-10-CM | POA: Diagnosis not present

## 2024-03-15 DIAGNOSIS — R030 Elevated blood-pressure reading, without diagnosis of hypertension: Secondary | ICD-10-CM | POA: Diagnosis not present

## 2024-03-15 DIAGNOSIS — M545 Low back pain, unspecified: Secondary | ICD-10-CM | POA: Diagnosis not present

## 2024-03-15 DIAGNOSIS — E559 Vitamin D deficiency, unspecified: Secondary | ICD-10-CM | POA: Diagnosis not present

## 2024-03-15 DIAGNOSIS — G8220 Paraplegia, unspecified: Secondary | ICD-10-CM | POA: Diagnosis not present

## 2024-03-15 DIAGNOSIS — F325 Major depressive disorder, single episode, in full remission: Secondary | ICD-10-CM | POA: Diagnosis not present

## 2024-03-23 DIAGNOSIS — Z961 Presence of intraocular lens: Secondary | ICD-10-CM | POA: Diagnosis not present

## 2024-03-23 DIAGNOSIS — H2512 Age-related nuclear cataract, left eye: Secondary | ICD-10-CM | POA: Diagnosis not present

## 2024-03-24 DIAGNOSIS — H2511 Age-related nuclear cataract, right eye: Secondary | ICD-10-CM | POA: Diagnosis not present

## 2024-04-06 DIAGNOSIS — H2511 Age-related nuclear cataract, right eye: Secondary | ICD-10-CM | POA: Diagnosis not present

## 2024-04-11 DIAGNOSIS — N319 Neuromuscular dysfunction of bladder, unspecified: Secondary | ICD-10-CM | POA: Diagnosis not present

## 2024-04-11 DIAGNOSIS — R339 Retention of urine, unspecified: Secondary | ICD-10-CM | POA: Diagnosis not present

## 2024-04-13 DIAGNOSIS — E1140 Type 2 diabetes mellitus with diabetic neuropathy, unspecified: Secondary | ICD-10-CM | POA: Diagnosis not present

## 2024-04-15 DIAGNOSIS — E785 Hyperlipidemia, unspecified: Secondary | ICD-10-CM | POA: Diagnosis not present

## 2024-04-15 DIAGNOSIS — M810 Age-related osteoporosis without current pathological fracture: Secondary | ICD-10-CM | POA: Diagnosis not present

## 2024-04-15 DIAGNOSIS — E1140 Type 2 diabetes mellitus with diabetic neuropathy, unspecified: Secondary | ICD-10-CM | POA: Diagnosis not present

## 2024-05-08 DIAGNOSIS — H209 Unspecified iridocyclitis: Secondary | ICD-10-CM | POA: Diagnosis not present

## 2024-05-10 DIAGNOSIS — N3941 Urge incontinence: Secondary | ICD-10-CM | POA: Diagnosis not present

## 2024-05-13 DIAGNOSIS — E1140 Type 2 diabetes mellitus with diabetic neuropathy, unspecified: Secondary | ICD-10-CM | POA: Diagnosis not present

## 2024-05-15 DIAGNOSIS — E785 Hyperlipidemia, unspecified: Secondary | ICD-10-CM | POA: Diagnosis not present

## 2024-05-15 DIAGNOSIS — M810 Age-related osteoporosis without current pathological fracture: Secondary | ICD-10-CM | POA: Diagnosis not present

## 2024-05-15 DIAGNOSIS — E1140 Type 2 diabetes mellitus with diabetic neuropathy, unspecified: Secondary | ICD-10-CM | POA: Diagnosis not present
